# Patient Record
Sex: Female | Born: 1975 | Race: White | Hispanic: No | Marital: Married | Smoking: Former smoker
Health system: Southern US, Community
[De-identification: ages and names within clinical notes are randomized; demographics above are authoritative.]

## PROBLEM LIST (undated history)

## (undated) DIAGNOSIS — Z87891 Personal history of nicotine dependence: Secondary | ICD-10-CM

## (undated) DIAGNOSIS — R1032 Left lower quadrant pain: Secondary | ICD-10-CM

## (undated) DIAGNOSIS — K08409 Partial loss of teeth, unspecified cause, unspecified class: Secondary | ICD-10-CM

## (undated) DIAGNOSIS — F329 Major depressive disorder, single episode, unspecified: Secondary | ICD-10-CM

## (undated) DIAGNOSIS — F988 Other specified behavioral and emotional disorders with onset usually occurring in childhood and adolescence: Secondary | ICD-10-CM

## (undated) DIAGNOSIS — K589 Irritable bowel syndrome without diarrhea: Secondary | ICD-10-CM

## (undated) DIAGNOSIS — Z9049 Acquired absence of other specified parts of digestive tract: Secondary | ICD-10-CM

## (undated) DIAGNOSIS — K219 Gastro-esophageal reflux disease without esophagitis: Secondary | ICD-10-CM

## (undated) DIAGNOSIS — F32A Depression, unspecified: Secondary | ICD-10-CM

## (undated) DIAGNOSIS — F419 Anxiety disorder, unspecified: Secondary | ICD-10-CM

## (undated) HISTORY — PX: FLEXIBLE SIGMOIDOSCOPY: SHX1649

## (undated) HISTORY — PX: APPENDECTOMY: SHX54

## (undated) HISTORY — PX: WISDOM TOOTH EXTRACTION: SHX21

## (undated) HISTORY — DX: Other specified behavioral and emotional disorders with onset usually occurring in childhood and adolescence: F98.8

---

## 2011-11-15 LAB — OB RESULTS CONSOLE ABO/RH: RH Type: POSITIVE

## 2011-11-15 LAB — OB RESULTS CONSOLE HEPATITIS B SURFACE ANTIGEN: Hepatitis B Surface Ag: NEGATIVE

## 2011-11-23 ENCOUNTER — Other Ambulatory Visit (HOSPITAL_COMMUNITY)
Admission: RE | Admit: 2011-11-23 | Discharge: 2011-11-23 | Disposition: A | Discharge status: Home / Self Care | Payer: Self-pay | Source: Ambulatory Visit | Attending: Obstetrics and Gynecology | Admitting: Obstetrics and Gynecology

## 2011-11-23 DIAGNOSIS — Z01419 Encounter for gynecological examination (general) (routine) without abnormal findings: Secondary | ICD-10-CM | POA: Insufficient documentation

## 2011-11-23 DIAGNOSIS — Z113 Encounter for screening for infections with a predominantly sexual mode of transmission: Secondary | ICD-10-CM | POA: Insufficient documentation

## 2011-12-30 ENCOUNTER — Other Ambulatory Visit (HOSPITAL_COMMUNITY): Payer: Self-pay | Admitting: Obstetrics and Gynecology

## 2011-12-30 ENCOUNTER — Other Ambulatory Visit: Payer: Self-pay

## 2011-12-30 DIAGNOSIS — O280 Abnormal hematological finding on antenatal screening of mother: Secondary | ICD-10-CM

## 2012-01-13 ENCOUNTER — Ambulatory Visit (HOSPITAL_COMMUNITY)
Admission: RE | Admit: 2012-01-13 | Discharge: 2012-01-13 | Disposition: A | Discharge status: Home / Self Care | Payer: Managed Care, Other (non HMO) | Source: Ambulatory Visit | Attending: Obstetrics and Gynecology | Admitting: Obstetrics and Gynecology

## 2012-01-13 ENCOUNTER — Encounter (HOSPITAL_COMMUNITY): Payer: Self-pay

## 2012-01-13 DIAGNOSIS — O280 Abnormal hematological finding on antenatal screening of mother: Secondary | ICD-10-CM

## 2012-01-13 DIAGNOSIS — Z1389 Encounter for screening for other disorder: Secondary | ICD-10-CM | POA: Insufficient documentation

## 2012-01-13 DIAGNOSIS — Z363 Encounter for antenatal screening for malformations: Secondary | ICD-10-CM | POA: Insufficient documentation

## 2012-01-13 DIAGNOSIS — O358XX0 Maternal care for other (suspected) fetal abnormality and damage, not applicable or unspecified: Secondary | ICD-10-CM | POA: Insufficient documentation

## 2012-01-13 DIAGNOSIS — O09519 Supervision of elderly primigravida, unspecified trimester: Secondary | ICD-10-CM | POA: Insufficient documentation

## 2012-01-13 DIAGNOSIS — O289 Unspecified abnormal findings on antenatal screening of mother: Secondary | ICD-10-CM | POA: Insufficient documentation

## 2012-01-13 NOTE — Progress Notes (Signed)
Ms. Laural Benes had an ultrasound appointment today.  Please see [facility] report for details.  Comments 1. An active singleton fetus is observed.  2. Biometry is appropriate for gestational age.  3. Amniotic fluid volume is normal.  4. Screening survey of the fetal anatomy was performed and choroid plexus (CP) cysts were identified. A targeted survey of the fetal anatomy was therefore performed and no other dysmorphic features, or morphologic "soft markers" associated with aneuploidy, were detected. Isolated choroid plexus cysts are seen in approximately 2% of second trimester fetuses, but in more than 90% of cases they resolve by 26 weeks. They are typically of no pathological significance. Several investigators, however, have reported a very modestly increased risk for trisomy 45, and less likely trisomy 27, when isolated CP cysts are identified. In the absence of other markers for trisomy 18, the patient's pretest risk is elevated by a factor of 1.5. A review of the patient's medical records demonstrate a test that is negative for detectable aneuploidy by free fetal DNA, and, thus, the patient remains low risk. Given that this has no functional implication and that the patient remains low risk for aneuploidy, the finding of CPCs was not discussed with your patient today.  Impression Active singleton fetus Size appropriate for dates CPC (isolated) Normal amniotic fluid volume   Recommendations Follow up as clinically indicated.  Rogelia Boga, MD, MS, FACOG Assistant Professor Section of Maternal-Fetal Medicine [facility]

## 2012-03-22 LAB — OB RESULTS CONSOLE RPR: RPR: NONREACTIVE

## 2012-05-10 NOTE — L&D Delivery Note (Signed)
Delivery Note At 8:59 PM a viable female was delivered via Vaginal, Spontaneous Delivery (Presentation: Right Occiput Anterior).  APGAR: 9, 9; weight .   Placenta status: Intact, Spontaneous.  Cord: 3 vessels with the following complications: Long.  Cord pH: n/a  Head and shoulders delivered prior to suctioning of mouth and nose.  Anesthesia: Epidural Local Lidocaine 1% Less than 10 cc Episiotomy: None Lacerations: 2nd degree;Vaginal;Perineal (Rt vaginal laceration to middle 1/3 of vagina). Suture Repair: 2.0 3.0 chromic Est. Blood Loss (mL): 300 Digital rectal exam wnl.  No palpable suture, normal rectal tone. Mom to postpartum.  Baby to skin to skin.  Geryl Rankins 06/02/2012, 9:49 PM

## 2012-05-15 LAB — OB RESULTS CONSOLE GBS: GBS: NEGATIVE

## 2012-05-31 ENCOUNTER — Inpatient Hospital Stay (HOSPITAL_COMMUNITY)
Admission: AD | Admit: 2012-05-31 | Discharge: 2012-05-31 | Disposition: A | Discharge status: Home / Self Care | Payer: Managed Care, Other (non HMO) | Source: Ambulatory Visit | Attending: Obstetrics and Gynecology | Admitting: Obstetrics and Gynecology

## 2012-05-31 ENCOUNTER — Encounter (HOSPITAL_COMMUNITY): Payer: Self-pay | Admitting: *Deleted

## 2012-05-31 DIAGNOSIS — O479 False labor, unspecified: Secondary | ICD-10-CM | POA: Insufficient documentation

## 2012-05-31 HISTORY — DX: Anxiety disorder, unspecified: F41.9

## 2012-05-31 HISTORY — DX: Irritable bowel syndrome, unspecified: K58.9

## 2012-05-31 HISTORY — DX: Major depressive disorder, single episode, unspecified: F32.9

## 2012-05-31 HISTORY — DX: Depression, unspecified: F32.A

## 2012-05-31 MED ORDER — LACTATED RINGERS IV SOLN
INTRAVENOUS | Status: DC
  Administered 2012-05-31 (×2): via INTRAVENOUS

## 2012-05-31 MED ORDER — OXYCODONE-ACETAMINOPHEN 5-325 MG PO TABS
2.0000 | ORAL_TABLET | Freq: Once | ORAL | Status: AC
  Administered 2012-05-31: 2 via ORAL
  Filled 2012-05-31: qty 2

## 2012-05-31 MED ORDER — MORPHINE SULFATE 10 MG/ML IJ SOLN
5.0000 mg | Freq: Once | INTRAMUSCULAR | Status: AC
  Administered 2012-05-31: 5 mg via INTRAVENOUS
  Filled 2012-05-31: qty 1

## 2012-05-31 NOTE — Progress Notes (Signed)
Patient and husband removed EFM and patient dressed to go home. Patient has felt no relief of pain from Percocet, but they state they want to go home because she is not comfortable here. Dr. Richardson Dopp notified and wanted to speak with patient. Phone taken to room, Dr. Richardson Dopp and patient had conversation. Decision was made to keep patient longer for observation, give IV fluids and IV pain medication. Patient and husband feel better with this plan and now feel that they have some "direction."

## 2012-05-31 NOTE — MAU Note (Signed)
Contractions since yesterday and through the night. Was not dilated at appointment yesterday. States is having some bleeding this morning. States has some numbness in R hand throughout pregnancy.

## 2012-06-02 ENCOUNTER — Inpatient Hospital Stay (HOSPITAL_COMMUNITY)
Admission: AD | Admit: 2012-06-02 | Discharge: 2012-06-04 | DRG: 775 | Disposition: A | Discharge status: Home / Self Care | Payer: Managed Care, Other (non HMO) | Source: Ambulatory Visit | Attending: Obstetrics and Gynecology | Admitting: Obstetrics and Gynecology

## 2012-06-02 ENCOUNTER — Encounter (HOSPITAL_COMMUNITY): Payer: Self-pay | Admitting: Anesthesiology

## 2012-06-02 ENCOUNTER — Encounter (HOSPITAL_COMMUNITY): Payer: Self-pay | Admitting: *Deleted

## 2012-06-02 ENCOUNTER — Inpatient Hospital Stay (HOSPITAL_COMMUNITY): Payer: Managed Care, Other (non HMO) | Admitting: Anesthesiology

## 2012-06-02 DIAGNOSIS — O365990 Maternal care for other known or suspected poor fetal growth, unspecified trimester, not applicable or unspecified: Secondary | ICD-10-CM | POA: Diagnosis present

## 2012-06-02 LAB — RPR: RPR Ser Ql: NONREACTIVE

## 2012-06-02 LAB — CBC
MCH: 27.6 pg (ref 26.0–34.0)
MCHC: 33.2 g/dL (ref 30.0–36.0)
Platelets: 196 10*3/uL (ref 150–400)
RDW: 14.4 % (ref 11.5–15.5)

## 2012-06-02 MED ORDER — OXYTOCIN 40 UNITS IN LACTATED RINGERS INFUSION - SIMPLE MED
1.0000 m[IU]/min | INTRAVENOUS | Status: DC
  Administered 2012-06-02: 2 m[IU]/min via INTRAVENOUS

## 2012-06-02 MED ORDER — TERBUTALINE SULFATE 1 MG/ML IJ SOLN: 0.2500 mg | Freq: Once | INTRAMUSCULAR | Status: DC | PRN

## 2012-06-02 MED ORDER — ACETAMINOPHEN 325 MG PO TABS
650.0000 mg | ORAL_TABLET | ORAL | Status: DC | PRN
  Administered 2012-06-02: 650 mg via ORAL
  Filled 2012-06-02: qty 2

## 2012-06-02 MED ORDER — IBUPROFEN 600 MG PO TABS
600.0000 mg | ORAL_TABLET | Freq: Four times a day (QID) | ORAL | Status: DC | PRN
  Administered 2012-06-02: 600 mg via ORAL
  Filled 2012-06-02: qty 1

## 2012-06-02 MED ORDER — EPHEDRINE 5 MG/ML INJ
10.0000 mg | INTRAVENOUS | Status: DC | PRN
  Administered 2012-06-02: 10 mg via INTRAVENOUS
  Filled 2012-06-02: qty 4

## 2012-06-02 MED ORDER — LACTATED RINGERS IV SOLN
INTRAVENOUS | Status: DC
  Administered 2012-06-02 (×2): via INTRAVENOUS

## 2012-06-02 MED ORDER — FENTANYL 2.5 MCG/ML BUPIVACAINE 1/10 % EPIDURAL INFUSION (WH - ANES)
14.0000 mL/h | INTRAMUSCULAR | Status: DC
  Administered 2012-06-02 (×3): 14 mL/h via EPIDURAL
  Filled 2012-06-02 (×3): qty 125

## 2012-06-02 MED ORDER — ONDANSETRON HCL 4 MG/2ML IJ SOLN: 4.0000 mg | Freq: Four times a day (QID) | INTRAMUSCULAR | Status: DC | PRN

## 2012-06-02 MED ORDER — PHENYLEPHRINE 40 MCG/ML (10ML) SYRINGE FOR IV PUSH (FOR BLOOD PRESSURE SUPPORT)
80.0000 ug | PREFILLED_SYRINGE | INTRAVENOUS | Status: DC | PRN
  Administered 2012-06-02: 80 ug via INTRAVENOUS
  Filled 2012-06-02: qty 5

## 2012-06-02 MED ORDER — CITRIC ACID-SODIUM CITRATE 334-500 MG/5ML PO SOLN: 30.0000 mL | ORAL | Status: DC | PRN

## 2012-06-02 MED ORDER — EPHEDRINE 5 MG/ML INJ
10.0000 mg | INTRAVENOUS | Status: DC | PRN
  Administered 2012-06-02: 10 mg via INTRAVENOUS

## 2012-06-02 MED ORDER — PHENYLEPHRINE 40 MCG/ML (10ML) SYRINGE FOR IV PUSH (FOR BLOOD PRESSURE SUPPORT)
80.0000 ug | PREFILLED_SYRINGE | INTRAVENOUS | Status: DC | PRN
  Administered 2012-06-02: 80 ug via INTRAVENOUS

## 2012-06-02 MED ORDER — OXYTOCIN BOLUS FROM INFUSION
500.0000 mL | INTRAVENOUS | Status: DC
  Administered 2012-06-02: 500 mL via INTRAVENOUS

## 2012-06-02 MED ORDER — LIDOCAINE HCL (PF) 1 % IJ SOLN
30.0000 mL | INTRAMUSCULAR | Status: DC | PRN
  Administered 2012-06-02: 30 mL via SUBCUTANEOUS
  Filled 2012-06-02 (×2): qty 30

## 2012-06-02 MED ORDER — LACTATED RINGERS IV SOLN
500.0000 mL | INTRAVENOUS | Status: DC | PRN
  Administered 2012-06-02: 500 mL via INTRAVENOUS

## 2012-06-02 MED ORDER — SODIUM BICARBONATE 8.4 % IV SOLN
INTRAVENOUS | Status: DC | PRN
  Administered 2012-06-02: 5 mL via EPIDURAL

## 2012-06-02 MED ORDER — OXYCODONE-ACETAMINOPHEN 5-325 MG PO TABS
1.0000 | ORAL_TABLET | ORAL | Status: DC | PRN
  Administered 2012-06-02: 1 via ORAL
  Filled 2012-06-02: qty 1

## 2012-06-02 MED ORDER — LACTATED RINGERS IV SOLN
500.0000 mL | Freq: Once | INTRAVENOUS | Status: AC
  Administered 2012-06-02: 500 mL via INTRAVENOUS

## 2012-06-02 MED ORDER — OXYTOCIN 40 UNITS IN LACTATED RINGERS INFUSION - SIMPLE MED
62.5000 mL/h | INTRAVENOUS | Status: DC
  Filled 2012-06-02: qty 1000

## 2012-06-02 MED ORDER — DIPHENHYDRAMINE HCL 50 MG/ML IJ SOLN: 12.5000 mg | INTRAMUSCULAR | Status: DC | PRN

## 2012-06-02 NOTE — MAU Note (Signed)
contractions 

## 2012-06-02 NOTE — Anesthesia Preprocedure Evaluation (Signed)

## 2012-06-02 NOTE — Anesthesia Procedure Notes (Signed)

## 2012-06-02 NOTE — H&P (Addendum)
Gabrielle Pierce is a 37 y.o. female G1 at 43 4/7 week presenting for contractions.  Latent labor x 2 days.  Upon arrival, pt was 2-3/80/-2.  Epidural placed soon after admission.  Pt is comfortable. Unable to see fluid with AROM, bag not appreciated.  Pt states she felt water run down her leg at 04:30 prior to getting to hospital. [facility] has been uncomplicated except for an abnormal quad screen.  Harmony was low risk.  Pt had an ultrasound last week.  EFW was 5 pounds, 11 ounces.  Maternal Medical History:  Reason for admission: Reason for admission: contractions.  Reason for Admission:   nauseaContractions: Onset was 2 days ago.   Frequency: irregular.   Duration is approximately 2 minutes.   Perceived severity is strong.    Fetal activity: Perceived fetal activity is normal.    Prenatal complications: Abnormal quad screen.  Harmony negative/low risk.  Prenatal Complications - Diabetes: none.    OB History    Grav Para Term Preterm Abortions TAB SAB Ect Mult Living   1 0 0 0 0 0 0 0 0 0      Past Medical History  Diagnosis Date  . IBS (irritable bowel syndrome)   . Depression   . Anxiety     prev. use of PRN xanax   Past Surgical History  Procedure Date  . Appendectomy   . Flexible sigmoidoscopy   . Wisdom tooth extraction    Family History: family history is not on file. Social History:  reports that she has quit smoking. Her smoking use included Cigarettes. She has never used smokeless tobacco. She reports that she does not drink alcohol or use illicit drugs.   Review of Systems  Constitutional: Negative for fever and chills.  HENT: Negative for congestion and sore throat.   Respiratory: Negative for shortness of breath.   Gastrointestinal: Positive for abdominal pain. Negative for nausea, vomiting and diarrhea.    Dilation: 3.5 Effacement (%): 90 Station: -1 Exam by:: vernado Blood pressure 122/77, pulse 83, temperature 98.4 F (36.9 C), temperature source Oral,  resp. rate 20, height 5\' 6"  (1.676 m), weight 104.781 kg (231 lb), last menstrual period 08/30/2011, SpO2 96.00%. Maternal Exam:  Uterine Assessment: Contraction duration is 3 minutes. Contraction frequency is regular.   Abdomen: Estimated fetal weight is 6 lbs..   Fetal presentation: vertex  Introitus: Normal vulva. Normal vagina.  Ferning test: not done.  Nitrazine test: not done. Bloody show.  Pelvis: adequate for delivery.   Cervix: Cervix evaluated by digital exam.     Fetal Exam Fetal Monitor Review: Baseline rate: Reactive.  Variability: moderate (6-25 bpm).   Pattern: accelerations present.    Fetal State Assessment: Category I - tracings are normal.     Physical Exam  Constitutional: She is oriented to person, place, and time. She appears well-developed and well-nourished. No distress.  HENT:  Head: Normocephalic and atraumatic.  Eyes: EOM are normal.  Neck: Normal range of motion.  Cardiovascular: Normal rate, regular rhythm and normal heart sounds.   GI: Soft. There is no tenderness.  Genitourinary: Uterus normal.  Musculoskeletal: She exhibits edema.  Neurological: She is alert and oriented to person, place, and time.  Skin: Skin is warm and dry. She is not diaphoretic.  Psychiatric: She has a normal mood and affect.    Prenatal labs: ABO, Rh: --/--/B POS, B POS (01/24 0530) Antibody: NEG (01/24 0530) Rubella: Nonimmune (07/08 0000) RPR: NON REACTIVE (01/24 0530)  HBsAg: Negative (07/08 0000)  HIV: Non-reactive (07/08 0000)  GBS: Negative (01/06 0000)   Assessment/Plan: Labor augmented with Pitocin. Likely SROM. IUPC placed to assess contraction strength due to minimal change on Pitocin. Comfortable with epidural. SGA.  Anticipate vaginal delivery.   Geryl Rankins 06/02/2012, 1:51 PM

## 2012-06-03 LAB — CBC
HCT: 32.3 % — ABNORMAL LOW (ref 36.0–46.0)
MCH: 27.4 pg (ref 26.0–34.0)
MCV: 85.2 fL (ref 78.0–100.0)
RBC: 3.79 MIL/uL — ABNORMAL LOW (ref 3.87–5.11)
WBC: 15.6 10*3/uL — ABNORMAL HIGH (ref 4.0–10.5)

## 2012-06-03 MED ORDER — METHYLERGONOVINE MALEATE 0.2 MG/ML IJ SOLN: 0.2000 mg | INTRAMUSCULAR | Status: DC | PRN

## 2012-06-03 MED ORDER — BENZOCAINE-MENTHOL 20-0.5 % EX AERO
1.0000 "application " | INHALATION_SPRAY | CUTANEOUS | Status: DC | PRN
  Administered 2012-06-03: 1 via TOPICAL
  Filled 2012-06-03: qty 56

## 2012-06-03 MED ORDER — METHYLERGONOVINE MALEATE 0.2 MG PO TABS: 0.2000 mg | ORAL_TABLET | ORAL | Status: DC | PRN

## 2012-06-03 MED ORDER — PRENATAL MULTIVITAMIN CH
1.0000 | ORAL_TABLET | Freq: Every day | ORAL | Status: DC
  Administered 2012-06-03 – 2012-06-04 (×2): 1 via ORAL
  Filled 2012-06-03 (×2): qty 1

## 2012-06-03 MED ORDER — OXYCODONE-ACETAMINOPHEN 5-325 MG PO TABS
1.0000 | ORAL_TABLET | ORAL | Status: DC | PRN
  Administered 2012-06-03 – 2012-06-04 (×3): 1 via ORAL
  Filled 2012-06-03 (×4): qty 1

## 2012-06-03 MED ORDER — SENNOSIDES-DOCUSATE SODIUM 8.6-50 MG PO TABS: 2.0000 | ORAL_TABLET | Freq: Every day | ORAL | Status: DC

## 2012-06-03 MED ORDER — LANOLIN HYDROUS EX OINT: TOPICAL_OINTMENT | CUTANEOUS | Status: DC | PRN

## 2012-06-03 MED ORDER — DIPHENHYDRAMINE HCL 25 MG PO CAPS: 25.0000 mg | ORAL_CAPSULE | Freq: Four times a day (QID) | ORAL | Status: DC | PRN

## 2012-06-03 MED ORDER — FERROUS SULFATE 325 (65 FE) MG PO TABS
325.0000 mg | ORAL_TABLET | Freq: Two times a day (BID) | ORAL | Status: DC
  Administered 2012-06-03 – 2012-06-04 (×3): 325 mg via ORAL
  Filled 2012-06-03 (×3): qty 1

## 2012-06-03 MED ORDER — OXYTOCIN 40 UNITS IN LACTATED RINGERS INFUSION - SIMPLE MED: 62.5000 mL/h | INTRAVENOUS | Status: DC | PRN

## 2012-06-03 MED ORDER — IBUPROFEN 600 MG PO TABS
600.0000 mg | ORAL_TABLET | Freq: Four times a day (QID) | ORAL | Status: DC
  Administered 2012-06-03 – 2012-06-04 (×6): 600 mg via ORAL
  Filled 2012-06-03 (×6): qty 1

## 2012-06-03 MED ORDER — SERTRALINE HCL 50 MG PO TABS
50.0000 mg | ORAL_TABLET | Freq: Every day | ORAL | Status: DC
  Administered 2012-06-03 (×2): 50 mg via ORAL
  Filled 2012-06-03 (×3): qty 1

## 2012-06-03 MED ORDER — DIBUCAINE 1 % RE OINT
1.0000 "application " | TOPICAL_OINTMENT | RECTAL | Status: DC | PRN
  Administered 2012-06-03: 1 via RECTAL
  Filled 2012-06-03: qty 28

## 2012-06-03 MED ORDER — MAGNESIUM HYDROXIDE 400 MG/5ML PO SUSP: 30.0000 mL | ORAL | Status: DC | PRN

## 2012-06-03 MED ORDER — ONDANSETRON HCL 4 MG/2ML IJ SOLN: 4.0000 mg | INTRAMUSCULAR | Status: DC | PRN

## 2012-06-03 MED ORDER — WITCH HAZEL-GLYCERIN EX PADS
1.0000 "application " | MEDICATED_PAD | CUTANEOUS | Status: DC | PRN
  Administered 2012-06-03: 1 via TOPICAL

## 2012-06-03 MED ORDER — ONDANSETRON HCL 4 MG PO TABS: 4.0000 mg | ORAL_TABLET | ORAL | Status: DC | PRN

## 2012-06-03 MED ORDER — SIMETHICONE 80 MG PO CHEW: 80.0000 mg | CHEWABLE_TABLET | ORAL | Status: DC | PRN

## 2012-06-03 NOTE — Anesthesia Postprocedure Evaluation (Signed)
  Anesthesia Post-op Note  Patient: Gabrielle Pierce  Procedure(s) Performed: * No procedures listed *  Patient Location: Mother/Baby  Anesthesia Type:Epidural  Level of Consciousness: awake, alert , oriented and patient cooperative  Airway and Oxygen Therapy: Patient Spontanous Breathing  Post-op Pain: mild  Post-op Assessment: Patient's Cardiovascular Status Stable, Respiratory Function Stable, RESPIRATORY FUNCTION UNSTABLE, No signs of Nausea or vomiting, Adequate PO intake and Pain level controlled  Post-op Vital Signs: Reviewed  Complications: No apparent anesthesia complications

## 2012-06-03 NOTE — Anesthesia Postprocedure Evaluation (Deleted)
  Anesthesia Post-op Note  Patient: Gabrielle Pierce  Procedure(s) Performed: * No procedures listed *  Patient Location: Mother/Baby  Anesthesia Type:Epidural  Level of Consciousness: awake, alert , oriented and patient cooperative  Airway and Oxygen Therapy: Patient Spontanous Breathing  Post-op Pain: Mild  Post-op Assessment: Patient's Cardiovascular Status Stable, Respiratory Function Stable, Patent Airway, No signs of Nausea or vomiting, Adequate PO intake and Pain level controlled  Post-op Vital Signs: Reviewed and stable  Complications: No apparent anesthesia complications

## 2012-06-03 NOTE — Progress Notes (Signed)
Post Partum Day 1 Subjective: no complaints  Bleeding is slowing.  Objective: Blood pressure 115/73, pulse 65, temperature 98.4 F (36.9 C), temperature source Oral, resp. rate 18, height 5\' 6"  (1.676 m), weight 104.781 kg (231 lb), last menstrual period 08/30/2011, SpO2 97.00%, unknown if currently breastfeeding.  Physical Exam:  General: alert, cooperative and no distress Lochia: not assessed Uterine Fundus: firm DVT Evaluation: No evidence of DVT seen on physical exam.   Basename 06/03/12 0602 06/02/12 0530  HGB 10.5* 12.7  HCT 32.3* 38.3    Assessment/Plan: Plan for discharge tomorrow Continue routine pp care.   LOS: 1 day   Britley Gashi 06/03/2012, 3:54 PM

## 2012-06-04 MED ORDER — IBUPROFEN 600 MG PO TABS: 600.0000 mg | ORAL_TABLET | Freq: Four times a day (QID) | ORAL | Status: DC

## 2012-06-04 MED ORDER — OXYCODONE-ACETAMINOPHEN 5-325 MG PO TABS: 1.0000 | ORAL_TABLET | ORAL | Status: DC | PRN

## 2012-06-04 MED ORDER — MEASLES, MUMPS & RUBELLA VAC ~~LOC~~ INJ
0.5000 mL | INJECTION | Freq: Once | SUBCUTANEOUS | Status: AC
  Administered 2012-06-04: 0.5 mL via SUBCUTANEOUS
  Filled 2012-06-04: qty 0.5

## 2012-06-04 NOTE — Progress Notes (Signed)
Post Partum Day 2 Subjective: no complaints  Bleeding slowing.  Baby with fever overnight.  Normal now.  WBCs normal.  Baby to stay for observation.  Objective: Blood pressure 114/75, pulse 58, temperature 97.7 F (36.5 C), temperature source Oral, resp. rate 18, height 5\' 6"  (1.676 m), weight 104.781 kg (231 lb), last menstrual period 08/30/2011, SpO2 97.00%, unknown if currently breastfeeding.  Physical Exam:  General: alert, cooperative and no distress Lochia: appropriate Uterine Fundus: firm Incision: not assessed DVT Evaluation: No evidence of DVT seen on physical exam.   Basename 06/03/12 0602 06/02/12 0530  HGB 10.5* 12.7  HCT 32.3* 38.3    Assessment/Plan: Discharge home H/o depression on Zoloft 50 mg.  F/u in 2 weeks.   Rubella non-immune. MMR prior to discharge.   LOS: 2 days   Meagan Spease 06/04/2012, 12:06 PM

## 2012-06-04 NOTE — Discharge Summary (Signed)
Obstetric Discharge Summary Reason for Admission: onset of labor Prenatal Procedures: none Intrapartum Procedures: spontaneous vaginal delivery Postpartum Procedures: Rubella Ig Complications-Operative and Postpartum: 2nd degree perineal laceration and vaginal laceration Hemoglobin  Date Value Range Status  06/03/2012 10.5* 12.0 - 15.0 g/dL Final     REPEATED TO VERIFY     DELTA CHECK NOTED     HCT  Date Value Range Status  06/03/2012 32.3* 36.0 - 46.0 % Final    Physical Exam:  WNL.  See progress notes.  Discharge Diagnoses: Term Pregnancy-delivered  Discharge Information: Date: 06/04/2012 Activity: pelvic rest Diet: routine Medications: PNV, Ibuprofen, Percocet and Zoloft 50 mg Condition: stable Instructions: See discharge instructions. Discharge to: home Follow-up Information    Follow up with Jessee Avers., MD. Schedule an appointment as soon as possible for a visit in 6 weeks. (Postpartum check)    Contact information:   [address] [place] [address] [phone]       Follow up with Jessee Avers., MD. Schedule an appointment as soon as possible for a visit in 2 weeks. (Baby blues check)    Contact information:   [address] [place] [address] [phone]          Newborn Data: Live born female  Birth Weight: 6 lb 5.1 oz (2865 g) APGAR: 9, 9  Fever postpartum day #2. WBCs wnl per pt.   Will stay overnight for observation.  Geryl Rankins 06/04/2012, 12:15 PM

## 2012-06-06 NOTE — Progress Notes (Signed)
Post discharge chart review completed.  

## 2013-02-09 ENCOUNTER — Encounter (HOSPITAL_COMMUNITY): Payer: Self-pay | Admitting: Emergency Medicine

## 2013-02-09 ENCOUNTER — Emergency Department (HOSPITAL_COMMUNITY)
Admission: EM | Admit: 2013-02-09 | Discharge: 2013-02-09 | Disposition: A | Discharge status: Home / Self Care | Payer: Managed Care, Other (non HMO) | Attending: Emergency Medicine | Admitting: Emergency Medicine

## 2013-02-09 DIAGNOSIS — S81009A Unspecified open wound, unspecified knee, initial encounter: Secondary | ICD-10-CM | POA: Insufficient documentation

## 2013-02-09 DIAGNOSIS — Z8659 Personal history of other mental and behavioral disorders: Secondary | ICD-10-CM | POA: Insufficient documentation

## 2013-02-09 DIAGNOSIS — Z203 Contact with and (suspected) exposure to rabies: Secondary | ICD-10-CM | POA: Insufficient documentation

## 2013-02-09 DIAGNOSIS — Y939 Activity, unspecified: Secondary | ICD-10-CM | POA: Insufficient documentation

## 2013-02-09 DIAGNOSIS — Z23 Encounter for immunization: Secondary | ICD-10-CM | POA: Insufficient documentation

## 2013-02-09 DIAGNOSIS — Y929 Unspecified place or not applicable: Secondary | ICD-10-CM | POA: Insufficient documentation

## 2013-02-09 DIAGNOSIS — Z87891 Personal history of nicotine dependence: Secondary | ICD-10-CM | POA: Insufficient documentation

## 2013-02-09 DIAGNOSIS — Z8719 Personal history of other diseases of the digestive system: Secondary | ICD-10-CM | POA: Insufficient documentation

## 2013-02-09 DIAGNOSIS — W540XXA Bitten by dog, initial encounter: Secondary | ICD-10-CM | POA: Insufficient documentation

## 2013-02-09 DIAGNOSIS — Z79899 Other long term (current) drug therapy: Secondary | ICD-10-CM | POA: Insufficient documentation

## 2013-02-09 MED ORDER — AMOXICILLIN-POT CLAVULANATE 875-125 MG PO TABS: 1.0000 | ORAL_TABLET | Freq: Two times a day (BID) | ORAL | Status: DC

## 2013-02-09 MED ORDER — RABIES VACCINE, PCEC IM SUSR
1.0000 mL | Freq: Once | INTRAMUSCULAR | Status: AC
  Administered 2013-02-09: 1 mL via INTRAMUSCULAR
  Filled 2013-02-09: qty 1

## 2013-02-09 MED ORDER — RABIES IMMUNE GLOBULIN 150 UNIT/ML IM INJ
20.0000 [IU]/kg | INJECTION | Freq: Once | INTRAMUSCULAR | Status: AC
  Administered 2013-02-09: 1875 [IU] via INTRAMUSCULAR
  Filled 2013-02-09: qty 12.5

## 2013-02-09 NOTE — ED Provider Notes (Signed)
CSN: [id number]     Arrival date & time 02/09/13  1627 History   First MD Initiated Contact with Patient 02/09/13 1651     Chief Complaint  Patient presents with  . Animal Bite   (Consider location/radiation/quality/duration/timing/severity/associated sxs/prior Treatment) HPI Comments: [name] who presents today after being bit by a pitbull on Tuesday. She reports she was out walking with her 15-month-old child when the dog attacked her. She had 3 bites to her left calf. Since the time of the attack the bite has healed. She has one scab to the back of her left calf. Her tetanus shot is up to date. She reports that she has been in contact with animal control. The dog has not been found and the owner has confessed that the dog is not up-to-date on his immunizations. It was recommended to her that she come to the emergency department for the rabies vaccine and injections. She denies fever, chills, malaise, myalgias, nausea, vomiting, shortness of breath, chest pain, numbness, weakness, paresthesias.  The history is provided by the patient. No language interpreter was used.    Past Medical History  Diagnosis Date  . IBS (irritable bowel syndrome)   . Depression   . Anxiety     prev. use of PRN xanax   Past Surgical History  Procedure Laterality Date  . Appendectomy    . Flexible sigmoidoscopy    . Wisdom tooth extraction     No family history on file. History  Substance Use Topics  . Smoking status: Former Smoker    Types: Cigarettes  . Smokeless tobacco: Never Used  . Alcohol Use: No     Comment: Prior to preg.   OB History   Grav Para Term Preterm Abortions TAB SAB Ect Mult Living   1 1 1  0 0 0 0 0 0 1     Review of Systems  Constitutional: Negative for fever, chills, diaphoresis and fatigue.  Respiratory: Negative for shortness of breath.   Cardiovascular: Negative for chest pain.  Gastrointestinal: Negative for nausea, vomiting and abdominal pain.   Skin: Positive for wound.  All other systems reviewed and are negative.    Allergies  Review of patient's allergies indicates no known allergies.  Home Medications   Current Outpatient Rx  Name  Route  Sig  Dispense  Refill  . amphetamine-dextroamphetamine (ADDERALL) 30 MG tablet   Oral   Take 15-30 mg by mouth 3 (three) times daily. Take 30mg  in the morning, 30mg  in the afternoon and 15mg  in the evening         . ibuprofen (ADVIL,MOTRIN) 200 MG tablet   Oral   Take 400 mg by mouth every 6 (six) hours as needed for pain.          BP 106/91  Pulse 92  Temp(Src) 98.1 F (36.7 C) (Oral)  Resp 18  Wt 205 lb (92.987 kg)  BMI 33.1 kg/m2  SpO2 100%  LMP 01/26/2013 Physical Exam  Nursing note and vitals reviewed. Constitutional: She is oriented to person, place, and time. She appears well-developed and well-nourished. No distress.  HENT:  Head: Normocephalic and atraumatic.  Right Ear: External ear normal.  Left Ear: External ear normal.  Nose: Nose normal.  Mouth/Throat: Oropharynx is clear and moist.  Eyes: Conjunctivae are normal.  Neck: Normal range of motion.  Cardiovascular: Normal rate, regular rhythm and normal heart sounds.   Pulmonary/Chest: Effort normal and breath sounds normal. No stridor. No respiratory distress.  She has no wheezes. She has no rales.  Abdominal: Soft. She exhibits no distension.  Musculoskeletal: Normal range of motion.  Neurological: She is alert and oriented to person, place, and time. She has normal strength.  Skin: Skin is warm and dry. She is not diaphoretic. No erythema.  1 cm area of scabbing on posterior left calf. The area appears to be healing well without surrounding erythema, streaking, warmth.  Psychiatric: She has a normal mood and affect. Her behavior is normal.    ED Course  Procedures (including critical care time) Labs Review Labs Reviewed - No data to display Imaging Review No results found.  MDM   1. Rabies  exposure   2. Dog bite, initial encounter    Patient presents after possible rabies exposure. She had 3 well healing dog bite on her left calf. She was given a prescription for Augmentin, but this only needs to fill this if she begins to show signs of infection including fever, chills, myalgias, erythema, streaking, warmth. Patient is reasonable and expressed understanding. Rabies vaccine and immunoglobulin was given today. TDAP up to date. The patient understands her followup with urgent care center in 3 days. Return instructions given. Vital signs stable for discharge. Patient / Family / Caregiver informed of clinical course, understand medical decision-making process, and agree with plan.     Mora Bellman, PA-C 02/09/13 2124

## 2013-02-09 NOTE — ED Notes (Signed)
Pt reports being bitten by a dog on her left lateral ankle and calf area. Pt reports that the animal has not been found after the bite because the animal jumped a fence as animal control tried to obtain the animal. The owner reported to the patient that the animal was not up to date on vaccinations.One small puncture wound noted to lateral ankle and posterior calf.  Pt is A/O x4 and in NAD.

## 2013-02-10 NOTE — ED Provider Notes (Signed)
Medical screening examination/treatment/procedure(s) were performed by non-physician practitioner and as supervising physician I was immediately available for consultation/collaboration.  Lorien Shingler M Terrion Poblano, MD 02/10/13 1324 

## 2013-02-12 ENCOUNTER — Emergency Department (HOSPITAL_COMMUNITY)
Admission: EM | Admit: 2013-02-12 | Discharge: 2013-02-12 | Disposition: A | Discharge status: Home / Self Care | Payer: Managed Care, Other (non HMO) | Source: Home / Self Care

## 2013-02-12 ENCOUNTER — Encounter (HOSPITAL_COMMUNITY): Payer: Self-pay | Admitting: Emergency Medicine

## 2013-02-12 DIAGNOSIS — Z203 Contact with and (suspected) exposure to rabies: Secondary | ICD-10-CM

## 2013-02-12 MED ORDER — RABIES VACCINE, PCEC IM SUSR
INTRAMUSCULAR | Status: AC
  Filled 2013-02-12: qty 1

## 2013-02-12 MED ORDER — RABIES VACCINE, PCEC IM SUSR
1.0000 mL | Freq: Once | INTRAMUSCULAR | Status: AC
  Administered 2013-02-12: 1 mL via INTRAMUSCULAR

## 2013-02-12 NOTE — ED Notes (Signed)
Rabies injection 

## 2013-02-18 ENCOUNTER — Emergency Department (HOSPITAL_COMMUNITY)
Admission: EM | Admit: 2013-02-18 | Discharge: 2013-02-18 | Disposition: A | Discharge status: Home / Self Care | Payer: Managed Care, Other (non HMO) | Source: Home / Self Care

## 2013-02-18 ENCOUNTER — Encounter (HOSPITAL_COMMUNITY): Payer: Self-pay | Admitting: Emergency Medicine

## 2013-02-18 MED ORDER — RABIES VACCINE, PCEC IM SUSR
1.0000 mL | Freq: Once | INTRAMUSCULAR | Status: AC
  Administered 2013-02-18: 1 mL via INTRAMUSCULAR

## 2013-02-18 MED ORDER — RABIES VACCINE, PCEC IM SUSR
INTRAMUSCULAR | Status: AC
  Filled 2013-02-18: qty 1

## 2013-02-18 NOTE — ED Notes (Signed)
Pt here for 3rd rabies injection. Pt reports no reaction to previous injections.

## 2014-03-11 ENCOUNTER — Encounter (HOSPITAL_COMMUNITY): Payer: Self-pay | Admitting: Emergency Medicine

## 2014-05-10 NOTE — L&D Delivery Note (Signed)
Delivery Note At 5:44 AM a healthy female was delivered via Vaginal, Spontaneous Delivery (Presentation: Occiput ant  ).  APGAR: 9, 9; weight  Pending.   Placenta status: Intact, Spontaneous.  Cord: 3 vessels with the following complications: None.  Cord pH: N/A  Anesthesia: Epidural  Episiotomy:  none Lacerations: 1st degree;Labial Suture Repair: 3.0 vicryl rapide Est. Blood Loss (mL):  200  Mom to postpartum.  Baby to Couplet care / Skin to Skin.  Jonai Weyland,MARIE-LYNE 01/18/2015, 6:04 AM

## 2014-06-05 LAB — OB RESULTS CONSOLE GC/CHLAMYDIA
Chlamydia: NEGATIVE
GC PROBE AMP, GENITAL: NEGATIVE

## 2014-06-05 LAB — OB RESULTS CONSOLE HIV ANTIBODY (ROUTINE TESTING): HIV: NONREACTIVE

## 2014-06-05 LAB — OB RESULTS CONSOLE ABO/RH: RH TYPE: POSITIVE

## 2014-06-05 LAB — OB RESULTS CONSOLE RUBELLA ANTIBODY, IGM: Rubella: NON-IMMUNE/NOT IMMUNE

## 2014-06-05 LAB — OB RESULTS CONSOLE RPR: RPR: NONREACTIVE

## 2014-06-05 LAB — OB RESULTS CONSOLE HEPATITIS B SURFACE ANTIGEN: Hepatitis B Surface Ag: NEGATIVE

## 2014-06-05 LAB — OB RESULTS CONSOLE ANTIBODY SCREEN: Antibody Screen: NEGATIVE

## 2014-12-25 LAB — OB RESULTS CONSOLE GBS: STREP GROUP B AG: NEGATIVE

## 2015-01-05 ENCOUNTER — Inpatient Hospital Stay (HOSPITAL_COMMUNITY)
Admission: RE | Admit: 2015-01-05 | Discharge: 2015-01-06 | Disposition: A | Discharge status: Home / Self Care | Payer: Managed Care, Other (non HMO) | Source: Ambulatory Visit | Attending: Obstetrics & Gynecology | Admitting: Obstetrics & Gynecology

## 2015-01-05 ENCOUNTER — Encounter (HOSPITAL_COMMUNITY): Payer: Self-pay | Admitting: *Deleted

## 2015-01-05 DIAGNOSIS — Z3689 Encounter for other specified antenatal screening: Secondary | ICD-10-CM

## 2015-01-05 DIAGNOSIS — O26899 Other specified pregnancy related conditions, unspecified trimester: Secondary | ICD-10-CM

## 2015-01-05 DIAGNOSIS — Z3A37 37 weeks gestation of pregnancy: Secondary | ICD-10-CM | POA: Insufficient documentation

## 2015-01-05 DIAGNOSIS — O99213 Obesity complicating pregnancy, third trimester: Secondary | ICD-10-CM

## 2015-01-05 DIAGNOSIS — R109 Unspecified abdominal pain: Secondary | ICD-10-CM | POA: Insufficient documentation

## 2015-01-05 DIAGNOSIS — R1032 Left lower quadrant pain: Secondary | ICD-10-CM | POA: Diagnosis present

## 2015-01-05 DIAGNOSIS — Z3A36 36 weeks gestation of pregnancy: Secondary | ICD-10-CM

## 2015-01-05 DIAGNOSIS — O26893 Other specified pregnancy related conditions, third trimester: Secondary | ICD-10-CM | POA: Insufficient documentation

## 2015-01-05 HISTORY — DX: Left lower quadrant pain: R10.32

## 2015-01-05 NOTE — Progress Notes (Signed)
Message left regarding patient in MAU and requesting evaluation per MD

## 2015-01-05 NOTE — MAU Note (Signed)
Pt states that abdominal pain started about 30 minutes ago. Pt states that this labor pain feels different than the first and that there is pain in lower abdomen. Pt denies leaking of fluid and bleeding and states that baby is active.

## 2015-01-05 NOTE — Progress Notes (Signed)
Called to notify of pt arrival in MAU. Informed of cervical exam and patient's extreme discomfort. Received orders for urinalysis and ultrasound and to call midwife for evaluation in MAU

## 2015-01-06 ENCOUNTER — Inpatient Hospital Stay (HOSPITAL_COMMUNITY): Payer: Managed Care, Other (non HMO)

## 2015-01-06 ENCOUNTER — Encounter (HOSPITAL_COMMUNITY): Payer: Self-pay | Admitting: Obstetrics and Gynecology

## 2015-01-06 DIAGNOSIS — R1032 Left lower quadrant pain: Secondary | ICD-10-CM

## 2015-01-06 DIAGNOSIS — O26893 Other specified pregnancy related conditions, third trimester: Secondary | ICD-10-CM | POA: Diagnosis not present

## 2015-01-06 DIAGNOSIS — R109 Unspecified abdominal pain: Secondary | ICD-10-CM | POA: Diagnosis present

## 2015-01-06 DIAGNOSIS — Z3A37 37 weeks gestation of pregnancy: Secondary | ICD-10-CM | POA: Diagnosis not present

## 2015-01-06 HISTORY — DX: Left lower quadrant pain: R10.32

## 2015-01-06 LAB — URINALYSIS, ROUTINE W REFLEX MICROSCOPIC
BILIRUBIN URINE: NEGATIVE
Glucose, UA: NEGATIVE mg/dL
HGB URINE DIPSTICK: NEGATIVE
Ketones, ur: NEGATIVE mg/dL
NITRITE: NEGATIVE
PROTEIN: NEGATIVE mg/dL
SPECIFIC GRAVITY, URINE: 1.025 (ref 1.005–1.030)
UROBILINOGEN UA: 0.2 mg/dL (ref 0.0–1.0)
pH: 8.5 — ABNORMAL HIGH (ref 5.0–8.0)

## 2015-01-06 LAB — CBC
HEMATOCRIT: 38.2 % (ref 36.0–46.0)
HEMOGLOBIN: 12.7 g/dL (ref 12.0–15.0)
MCH: 27.8 pg (ref 26.0–34.0)
MCHC: 33.2 g/dL (ref 30.0–36.0)
MCV: 83.6 fL (ref 78.0–100.0)
Platelets: 175 10*3/uL (ref 150–400)
RBC: 4.57 MIL/uL (ref 3.87–5.11)
RDW: 14.9 % (ref 11.5–15.5)
WBC: 15.4 10*3/uL — AB (ref 4.0–10.5)

## 2015-01-06 LAB — URINE MICROSCOPIC-ADD ON

## 2015-01-06 MED ORDER — OXYCODONE-ACETAMINOPHEN 5-325 MG PO TABS
2.0000 | ORAL_TABLET | Freq: Once | ORAL | Status: AC
  Administered 2015-01-06: 2 via ORAL
  Filled 2015-01-06: qty 2

## 2015-01-06 NOTE — MAU Note (Signed)
Gabrielle Pierce given report and patient discharged to home with instructions. Patient requesting being tested for BV and [name] will do this in the office this week.

## 2015-01-06 NOTE — Discharge Instructions (Signed)
Abdominal Pain During Pregnancy Abdominal pain is common in pregnancy. Most of the time, it does not cause harm. There are many causes of abdominal pain. Some causes are more serious than others. Some of the causes of abdominal pain in pregnancy are easily diagnosed. Occasionally, the diagnosis takes time to understand. Other times, the cause is not determined. Abdominal pain can be a sign that something is very wrong with the pregnancy, or the pain may have nothing to do with the pregnancy at all. For this reason, always tell your health care provider if you have any abdominal discomfort. HOME CARE INSTRUCTIONS  Monitor your abdominal pain for any changes. The following actions may help to alleviate any discomfort you are experiencing:  Do not have sexual intercourse or put anything in your vagina until your symptoms go away completely.  Get plenty of rest until your pain improves.  Drink clear fluids if you feel nauseous. Avoid solid food as long as you are uncomfortable or nauseous.  Only take over-the-counter or prescription medicine as directed by your health care provider.  Keep all follow-up appointments with your health care provider. SEEK IMMEDIATE MEDICAL CARE IF:  You are bleeding, leaking fluid, or passing tissue from the vagina.  You have increasing pain or cramping.  You have persistent vomiting.  You have painful or bloody urination.  You have a fever.  You notice a decrease in your baby's movements.  You have extreme weakness or feel faint.  You have shortness of breath, with or without abdominal pain.  You develop a severe headache with abdominal pain.  You have abnormal vaginal discharge with abdominal pain.  You have persistent diarrhea.  You have abdominal pain that continues even after rest, or gets worse. MAKE SURE YOU:   Understand these instructions.  Will watch your condition.  Will get help right away if you are not doing well or get  worse. Document Released: 04/26/2005 Document Revised: 02/14/2013 Document Reviewed: 11/23/2012 [facility]ExitCare Patient Information 2015 [place], [place]. This information is not intended to replace advice given to you by your health care provider. Make sure you discuss any questions you have with your health care provider. Braxton Hicks Contractions Contractions of the uterus can occur throughout pregnancy. Contractions are not always a sign that you are in labor.  WHAT ARE BRAXTON HICKS CONTRACTIONS?  Contractions that occur before labor are called Braxton Hicks contractions, or false labor. Toward the end of pregnancy (32-34 weeks), these contractions can develop more often and may become more forceful. This is not true labor because these contractions do not result in opening (dilatation) and thinning of the cervix. They are sometimes difficult to tell apart from true labor because these contractions can be forceful and people have different pain tolerances. You should not feel embarrassed if you go to the hospital with false labor. Sometimes, the only way to tell if you are in true labor is for your health care provider to look for changes in the cervix. If there are no prenatal problems or other health problems associated with the pregnancy, it is completely safe to be sent home with false labor and await the onset of true labor. HOW CAN YOU TELL THE DIFFERENCE BETWEEN TRUE AND FALSE LABOR? False Labor  The contractions of false labor are usually shorter and not as hard as those of true labor.   The contractions are usually irregular.   The contractions are often felt in the front of the lower abdomen and in the groin.  groin.   °· The contractions may go away when you walk around or change positions while lying down.   °· The contractions get weaker and are shorter lasting as time goes on.   °· The contractions do not usually become progressively stronger, regular, and closer together as with true labor.    °True Labor °· Contractions in true labor last 30-70 seconds, become very regular, usually become more intense, and increase in frequency.   °· The contractions do not go away with walking.   °· The discomfort is usually felt in the top of the uterus and spreads to the lower abdomen and low back.   °· True labor can be determined by your health care provider with an exam. This will show that the cervix is dilating and getting thinner.   °WHAT TO REMEMBER °· Keep up with your usual exercises and follow other instructions given by your health care provider.   °· Take medicines as directed by your health care provider.   °· Keep your regular prenatal appointments.   °· Eat and drink lightly if you think you are going into labor.   °· If Braxton Hicks contractions are making you uncomfortable:   °¨ Change your position from lying down or resting to walking, or from walking to resting.   °¨ Sit and rest in a tub of warm water.   °¨ Drink 2-3 glasses of water. Dehydration may cause these contractions.   °¨ Do slow and deep breathing several times an hour.   °WHEN SHOULD I SEEK IMMEDIATE MEDICAL CARE? °Seek immediate medical care if: °· Your contractions become stronger, more regular, and closer together.   °· You have fluid leaking or gushing from your vagina.   °· You have a fever.   °· You pass blood-tinged mucus.   °· You have vaginal bleeding.   °· You have continuous abdominal pain.   °· You have low back pain that you never had before.   °· You feel your baby's head pushing down and causing pelvic pressure.   °· Your baby is not moving as much as it used to.   °Document Released: 04/26/2005 Document Revised: 05/01/2013 Document Reviewed: 02/05/2013 °ExitCare® Patient Information ©2015 [employer], LLC. This information is not intended to replace advice given to you by your health care provider. Make sure you discuss any questions you have with your health care provider. ° °

## 2015-01-06 NOTE — MAU Provider Note (Signed)
History     CSN: [id number]  Arrival date and time: 01/05/15 2329  Provider notified: 2343 Provider on unit: 2355 Provider at bedside: 0125   Chief Complaint  Patient presents with  . Labor Eval   HPI  Ms. Gabrielle Pierce is a 39 yo G2P1001 female at 36.[redacted] wks gestation by LMP, presenting with complaints of sudden onset of LLQ that woke her up out of her sleep.  She has a h/o IBS and usually has pain like this with constipation. She has had BM every 2-3 days, but reports a normal BM tonight. She is having some contractions and they worsen the pain.  Her prenatal care is significant for placenta previa (RESOLVED) GERD, BV and ADD. Her primary OB provider at [facility] is Dr. Juliene Pina.  Past Medical History  Diagnosis Date  . IBS (irritable bowel syndrome)   . Depression   . Anxiety     prev. use of PRN xanax  . Acute left lower quadrant pain 01/06/2015    Past Surgical History  Procedure Laterality Date  . Appendectomy    . Flexible sigmoidoscopy    . Wisdom tooth extraction      History reviewed. No pertinent family history.  Social History  Substance Use Topics  . Smoking status: Former Smoker    Types: Cigarettes  . Smokeless tobacco: Never Used  . Alcohol Use: No     Comment: Prior to preg.    Allergies: No Known Allergies  Prescriptions prior to admission  Medication Sig Dispense Refill Last Dose  . Prenatal Vit-Fe Fumarate-FA (MULTIVITAMIN-PRENATAL) 27-0.8 MG TABS tablet Take 1 tablet by mouth daily at 12 noon.   01/04/2015 at Unknown time  . amoxicillin-clavulanate (AUGMENTIN) 875-125 MG per tablet Take 1 tablet by mouth every 12 (twelve) hours. 14 tablet 0 More than a month at Unknown time  . amphetamine-dextroamphetamine (ADDERALL) 30 MG tablet Take 15-30 mg by mouth 3 (three) times daily. Take 30mg  in the morning, 30mg  in the afternoon and 15mg  in the evening   More than a month at Unknown time  . ibuprofen (ADVIL,MOTRIN) 200 MG tablet Take 400 mg by mouth every 6 (six)  hours as needed for pain.   More than a month at Unknown time    Review of Systems  Constitutional: Negative.   HENT: Negative.   Eyes: Negative.   Respiratory: Negative.   Gastrointestinal: Positive for constipation.       Have IBS; had BM tonight, but woke up out of sleep with sudden, sharp LLQ that worsens with UC's; UC's about every 5 mins; lots of (+) FM; no VB or LOF  Musculoskeletal: Negative.   Skin: Negative.   Neurological: Negative.   Endo/Heme/Allergies: Negative.   Psychiatric/Behavioral: Negative.    Results for orders placed or performed during the hospital encounter of 01/05/15 (from the past 24 hour(s))  Urinalysis, Routine w reflex microscopic (not at [facility])     Status: Abnormal   Collection Time: 01/06/15 12:05 AM  Result Value Ref Range   Color, Urine YELLOW YELLOW   APPearance CLOUDY (A) CLEAR   Specific Gravity, Urine 1.025 1.005 - 1.030   pH 8.5 (H) 5.0 - 8.0   Glucose, UA NEGATIVE NEGATIVE mg/dL   Hgb urine dipstick NEGATIVE NEGATIVE   Bilirubin Urine NEGATIVE NEGATIVE   Ketones, ur NEGATIVE NEGATIVE mg/dL   Protein, ur NEGATIVE NEGATIVE mg/dL   Urobilinogen, UA 0.2 0.0 - 1.0 mg/dL   Nitrite NEGATIVE NEGATIVE   Leukocytes, UA MODERATE (A) NEGATIVE  Urine microscopic-add on     Status: Abnormal   Collection Time: 01/06/15 12:05 AM  Result Value Ref Range   Squamous Epithelial / LPF RARE RARE   WBC, UA 7-10 <3 WBC/hpf   Bacteria, UA FEW (A) RARE   Urine-Other MUCOUS PRESENT    CEFM FHR: 130s / moderate variability / accels present TOCO: were every 3-5 mins, now irregular UC's  OB Limited U/S Cephalic presentation / AFI WNL / placenta posterior above cx os / no placental abruption  Physical Exam   Blood pressure 102/59, pulse 63, temperature 98.4 F (36.9 C), temperature source Oral, resp. rate 18, height  (1.651 m), weight 108.863 kg (240 lb), SpO2 100 %.  Physical Exam  Constitutional: She is oriented to person, place, and time. She  appears well-developed and well-nourished.  HENT:  Head: Normocephalic.  Eyes: Pupils are equal, round, and reactive to light.  Neck: Normal range of motion.  Cardiovascular: Normal rate, regular rhythm and normal heart sounds.   Respiratory: Effort normal.  GI: Soft. Bowel sounds are normal.  Genitourinary:  VE by RN: 1/50/high/vtx by Margo Aye  Musculoskeletal: Normal range of motion.  Neurological: She is alert and oriented to person, place, and time.  Skin: Skin is warm and dry.  Psychiatric: She has a normal mood and affect. Her behavior is normal. Judgment and thought content normal.    MAU Course  Procedures CCUA CEFM CBC OB Limited U/S Percocet 2 tabs Observation x 2 hrs Assessment and Plan  G2P1001 at 36.[redacted] wks gestation LLQ abdominal pain - relieved after Percocet and obs Category 1 FHR tracing  Discharge home Keep scheduled appointments in the office Call the office before coming to the hospital, for any further questions, problems or concerns  *Dr. Seymour Bars notified of assessment and plan - agrees  Raelyn Mora, Judie Petit MSN, CNM 01/06/2015, 1:32 AM

## 2015-01-06 NOTE — MAU Note (Signed)
Pt having considerable back pain as well d/t history of bulging discs and herniation.

## 2015-01-16 ENCOUNTER — Telehealth (HOSPITAL_COMMUNITY): Payer: Self-pay | Admitting: *Deleted

## 2015-01-16 ENCOUNTER — Encounter (HOSPITAL_COMMUNITY): Payer: Self-pay | Admitting: *Deleted

## 2015-01-16 NOTE — Telephone Encounter (Signed)
Preadmission screen  

## 2015-01-17 ENCOUNTER — Inpatient Hospital Stay (HOSPITAL_COMMUNITY)
Admission: AD | Admit: 2015-01-17 | Discharge: 2015-01-20 | DRG: 775 | Disposition: A | Discharge status: Home / Self Care | Payer: Managed Care, Other (non HMO) | Source: Ambulatory Visit | Attending: Obstetrics & Gynecology | Admitting: Obstetrics & Gynecology

## 2015-01-17 ENCOUNTER — Inpatient Hospital Stay (HOSPITAL_COMMUNITY): Payer: Managed Care, Other (non HMO) | Admitting: Anesthesiology

## 2015-01-17 ENCOUNTER — Encounter (HOSPITAL_COMMUNITY): Payer: Self-pay | Admitting: *Deleted

## 2015-01-17 DIAGNOSIS — O99892 Other specified diseases and conditions complicating childbirth: Secondary | ICD-10-CM

## 2015-01-17 DIAGNOSIS — Z283 Underimmunization status: Secondary | ICD-10-CM

## 2015-01-17 DIAGNOSIS — R05 Cough: Secondary | ICD-10-CM | POA: Diagnosis present

## 2015-01-17 DIAGNOSIS — O99344 Other mental disorders complicating childbirth: Secondary | ICD-10-CM | POA: Diagnosis present

## 2015-01-17 DIAGNOSIS — Z3A38 38 weeks gestation of pregnancy: Secondary | ICD-10-CM | POA: Diagnosis present

## 2015-01-17 DIAGNOSIS — O99214 Obesity complicating childbirth: Secondary | ICD-10-CM | POA: Diagnosis present

## 2015-01-17 DIAGNOSIS — O9962 Diseases of the digestive system complicating childbirth: Secondary | ICD-10-CM | POA: Diagnosis present

## 2015-01-17 DIAGNOSIS — O9989 Other specified diseases and conditions complicating pregnancy, childbirth and the puerperium: Secondary | ICD-10-CM

## 2015-01-17 DIAGNOSIS — Z87891 Personal history of nicotine dependence: Secondary | ICD-10-CM | POA: Diagnosis not present

## 2015-01-17 DIAGNOSIS — K219 Gastro-esophageal reflux disease without esophagitis: Secondary | ICD-10-CM | POA: Diagnosis present

## 2015-01-17 DIAGNOSIS — R1032 Left lower quadrant pain: Secondary | ICD-10-CM

## 2015-01-17 DIAGNOSIS — F988 Other specified behavioral and emotional disorders with onset usually occurring in childhood and adolescence: Secondary | ICD-10-CM | POA: Diagnosis present

## 2015-01-17 DIAGNOSIS — O4290 Premature rupture of membranes, unspecified as to length of time between rupture and onset of labor, unspecified weeks of gestation: Secondary | ICD-10-CM | POA: Diagnosis present

## 2015-01-17 DIAGNOSIS — O9952 Diseases of the respiratory system complicating childbirth: Secondary | ICD-10-CM | POA: Diagnosis present

## 2015-01-17 DIAGNOSIS — K589 Irritable bowel syndrome without diarrhea: Secondary | ICD-10-CM | POA: Diagnosis present

## 2015-01-17 DIAGNOSIS — Z2839 Other underimmunization status: Secondary | ICD-10-CM

## 2015-01-17 DIAGNOSIS — Z6841 Body Mass Index (BMI) 40.0 and over, adult: Secondary | ICD-10-CM

## 2015-01-17 HISTORY — DX: Personal history of nicotine dependence: Z87.891

## 2015-01-17 HISTORY — DX: Partial loss of teeth, unspecified cause, unspecified class: K08.409

## 2015-01-17 HISTORY — DX: Acquired absence of other specified parts of digestive tract: Z90.49

## 2015-01-17 LAB — CBC
HCT: 36.4 % (ref 36.0–46.0)
Hemoglobin: 11.8 g/dL — ABNORMAL LOW (ref 12.0–15.0)
MCH: 27.3 pg (ref 26.0–34.0)
MCHC: 32.4 g/dL (ref 30.0–36.0)
MCV: 84.1 fL (ref 78.0–100.0)
PLATELETS: 170 10*3/uL (ref 150–400)
RBC: 4.33 MIL/uL (ref 3.87–5.11)
RDW: 14.9 % (ref 11.5–15.5)
WBC: 11.4 10*3/uL — AB (ref 4.0–10.5)

## 2015-01-17 LAB — POCT FERN TEST: POCT FERN TEST: POSITIVE

## 2015-01-17 LAB — TYPE AND SCREEN
ABO/RH(D): B POS
ANTIBODY SCREEN: NEGATIVE

## 2015-01-17 MED ORDER — OXYTOCIN BOLUS FROM INFUSION: 500.0000 mL | INTRAVENOUS | Status: DC

## 2015-01-17 MED ORDER — FENTANYL 2.5 MCG/ML BUPIVACAINE 1/10 % EPIDURAL INFUSION (WH - ANES): 14.0000 mL/h | INTRAMUSCULAR | Status: DC | PRN

## 2015-01-17 MED ORDER — PHENYLEPHRINE 40 MCG/ML (10ML) SYRINGE FOR IV PUSH (FOR BLOOD PRESSURE SUPPORT)
80.0000 ug | PREFILLED_SYRINGE | INTRAVENOUS | Status: DC | PRN
  Filled 2015-01-17: qty 2
  Filled 2015-01-17: qty 20

## 2015-01-17 MED ORDER — OXYTOCIN 40 UNITS IN LACTATED RINGERS INFUSION - SIMPLE MED
62.5000 mL/h | INTRAVENOUS | Status: DC
  Filled 2015-01-17: qty 1000

## 2015-01-17 MED ORDER — DIPHENHYDRAMINE HCL 50 MG/ML IJ SOLN: 12.5000 mg | INTRAMUSCULAR | Status: DC | PRN

## 2015-01-17 MED ORDER — CITRIC ACID-SODIUM CITRATE 334-500 MG/5ML PO SOLN: 30.0000 mL | ORAL | Status: DC | PRN

## 2015-01-17 MED ORDER — TERBUTALINE SULFATE 1 MG/ML IJ SOLN
0.2500 mg | Freq: Once | INTRAMUSCULAR | Status: DC | PRN
  Filled 2015-01-17: qty 1

## 2015-01-17 MED ORDER — LACTATED RINGERS IV SOLN
INTRAVENOUS | Status: DC
  Administered 2015-01-17 – 2015-01-18 (×2): via INTRAVENOUS

## 2015-01-17 MED ORDER — ONDANSETRON HCL 4 MG/2ML IJ SOLN: 4.0000 mg | Freq: Four times a day (QID) | INTRAMUSCULAR | Status: DC | PRN

## 2015-01-17 MED ORDER — ACETAMINOPHEN 325 MG PO TABS: 650.0000 mg | ORAL_TABLET | ORAL | Status: DC | PRN

## 2015-01-17 MED ORDER — OXYCODONE-ACETAMINOPHEN 5-325 MG PO TABS: 1.0000 | ORAL_TABLET | ORAL | Status: DC | PRN

## 2015-01-17 MED ORDER — FENTANYL 2.5 MCG/ML BUPIVACAINE 1/10 % EPIDURAL INFUSION (WH - ANES)
14.0000 mL/h | INTRAMUSCULAR | Status: DC | PRN
  Administered 2015-01-18: 14 mL/h via EPIDURAL
  Filled 2015-01-17: qty 125

## 2015-01-17 MED ORDER — EPHEDRINE 5 MG/ML INJ
10.0000 mg | INTRAVENOUS | Status: DC | PRN
  Filled 2015-01-17: qty 2

## 2015-01-17 MED ORDER — OXYTOCIN 40 UNITS IN LACTATED RINGERS INFUSION - SIMPLE MED
1.0000 m[IU]/min | INTRAVENOUS | Status: DC
  Administered 2015-01-17: 2 m[IU]/min via INTRAVENOUS

## 2015-01-17 MED ORDER — FLEET ENEMA 7-19 GM/118ML RE ENEM: 1.0000 | ENEMA | RECTAL | Status: DC | PRN

## 2015-01-17 MED ORDER — LACTATED RINGERS IV SOLN
500.0000 mL | INTRAVENOUS | Status: DC | PRN
Start: 2015-01-17 — End: 2015-01-18
  Administered 2015-01-17: 500 mL via INTRAVENOUS

## 2015-01-17 MED ORDER — LIDOCAINE HCL (PF) 1 % IJ SOLN
30.0000 mL | INTRAMUSCULAR | Status: DC | PRN
  Administered 2015-01-18: 30 mL via SUBCUTANEOUS
  Filled 2015-01-17: qty 30

## 2015-01-17 MED ORDER — OXYCODONE-ACETAMINOPHEN 5-325 MG PO TABS: 2.0000 | ORAL_TABLET | ORAL | Status: DC | PRN

## 2015-01-17 NOTE — H&P (Signed)
Gabrielle Pierce is a 39 y.o. female G2P1001 [redacted]w[redacted]d presenting for SROM.  HPP/HPI:  AMA 38 yo.  Previous FT SVD.  Ultrascreen wnl.  [place] Isolated EIF.  Placenta previa resolved at 34+ wks, EFW 43%, AFI wnl.  Clear AF leak.  No regular UC yet.  Good FMs.  No PEC Sx.  OB History    Gravida Para Term Preterm AB TAB SAB Ectopic Multiple Living   0 0 0 0 0 0 1     Past Medical History  Diagnosis Date  . IBS (irritable bowel syndrome)   . Depression   . Anxiety     prev. use of PRN xanax  . Acute left lower quadrant pain 01/06/2015  . ADD (attention deficit disorder)    Past Surgical History  Procedure Laterality Date  . Appendectomy    . Flexible sigmoidoscopy    . Wisdom tooth extraction     Family History: family history includes Hashimoto's thyroiditis in her sister; Rheum arthritis in her mother. Social History:  reports that she has quit smoking. Her smoking use included Cigarettes. She has never used smokeless tobacco. She reports that she does not drink alcohol or use illicit drugs.  No Known Allergies  Dilation: 4 Effacement (%): 80 Station: -3 Exam by:: [employer], RN   Blood pressure 136/67, pulse 100, temperature 99 F (37.2 C), temperature source Oral, resp. rate 18, height  (1.651 m), weight 246 lb (111.585 kg). Exam Physical Exam   FHR 130-140's with good accelerations and variability, no deceleration. UCs irregular mild.  Started Pitocin.  HPP:  Patient Active Problem List   Diagnosis Date Noted  . Normal labor 01/17/2015  . Indication for care or intervention in labor or delivery 01/17/2015  . Acute left lower quadrant pain 01/06/2015    Prenatal labs: ABO, Rh: --/--/B POS (09/09 2100) Antibody: NEG (09/09 2100) Rubella:  Non-Immune RPR: Nonreactive (01/27 0000)  HBsAg: Negative (01/27 0000)  HIV: Non-reactive (01/27 0000)  Genetic testing: Ultrascreen wnl [place] anato: wnl except isolated EIF.  Placenta previa, resolved at 34+ wks. 1 hr GTT:  wnl GBS: Negative (08/17 0000)   Assessment/Plan: 38+ wks with SROM.  Pitocin induction.  FHR Cat 1.  Epidural PRN.  Expectant management towards probable vaginal delivery.   Isabella Roemmich,MARIE-LYNE 01/17/2015, 10:38 PM

## 2015-01-17 NOTE — MAU Note (Addendum)
Pt reports leaking of fluid since 1830. Pt reports mild cramping

## 2015-01-17 NOTE — MAU Note (Signed)
Leaked fld about an hour ago and leaked one time since. Clear fld 3cm on Tues. Feeling a lot of pressure but not really pain. For induction 0630 on Tues.

## 2015-01-17 NOTE — Anesthesia Preprocedure Evaluation (Signed)
Anesthesia Evaluation  Patient identified by MRN, date of birth, ID band Patient awake    Reviewed: Allergy & Precautions, H&P , Patient's Chart, lab work & pertinent test results  Airway Mallampati: II  TM Distance: >3 FB Neck ROM: full    Dental  (+) Teeth Intact   Pulmonary Recent URI , Residual Cough, former smoker,    breath sounds clear to auscultation       Cardiovascular  Rhythm:regular Rate:Normal     Neuro/Psych    GI/Hepatic GERD  Medicated,  Endo/Other  Morbid obesity  Renal/GU      Musculoskeletal   Abdominal   Peds  Hematology   Anesthesia Other Findings Recent URI     Reproductive/Obstetrics (+) Pregnancy                             Anesthesia Physical Anesthesia Plan  ASA: III  Anesthesia Plan: Epidural   Post-op Pain Management:    Induction:   Airway Management Planned:   Additional Equipment:   Intra-op Plan:   Post-operative Plan:   Informed Consent: I have reviewed the patients History and Physical, chart, labs and discussed the procedure including the risks, benefits and alternatives for the proposed anesthesia with the patient or authorized representative who has indicated his/her understanding and acceptance.   Dental Advisory Given  Plan Discussed with:   Anesthesia Plan Comments: (Labs checked- platelets confirmed with RN in room. Fetal heart tracing, per RN, reported to be stable enough for sitting procedure. Discussed epidural, and patient consents to the procedure:  included risk of possible headache,backache, failed block, allergic reaction, and nerve injury. This patient was asked if she had any questions or concerns before the procedure started.)        Anesthesia Quick Evaluation

## 2015-01-18 ENCOUNTER — Encounter (HOSPITAL_COMMUNITY): Payer: Self-pay

## 2015-01-18 LAB — RPR: RPR: NONREACTIVE

## 2015-01-18 MED ORDER — DIPHENHYDRAMINE HCL 25 MG PO CAPS: 25.0000 mg | ORAL_CAPSULE | Freq: Four times a day (QID) | ORAL | Status: DC | PRN

## 2015-01-18 MED ORDER — WITCH HAZEL-GLYCERIN EX PADS: 1.0000 "application " | MEDICATED_PAD | CUTANEOUS | Status: DC | PRN

## 2015-01-18 MED ORDER — IBUPROFEN 600 MG PO TABS
600.0000 mg | ORAL_TABLET | Freq: Four times a day (QID) | ORAL | Status: DC
  Administered 2015-01-18 – 2015-01-19 (×4): 600 mg via ORAL
  Filled 2015-01-18 (×4): qty 1

## 2015-01-18 MED ORDER — OXYCODONE-ACETAMINOPHEN 5-325 MG PO TABS: 2.0000 | ORAL_TABLET | ORAL | Status: DC | PRN

## 2015-01-18 MED ORDER — LIDOCAINE HCL (PF) 1 % IJ SOLN
INTRAMUSCULAR | Status: DC | PRN
  Administered 2015-01-18: 4 mL
  Administered 2015-01-18: 6 mL via EPIDURAL

## 2015-01-18 MED ORDER — SENNOSIDES-DOCUSATE SODIUM 8.6-50 MG PO TABS
2.0000 | ORAL_TABLET | ORAL | Status: DC
  Administered 2015-01-19 – 2015-01-20 (×2): 2 via ORAL
  Filled 2015-01-18 (×2): qty 2

## 2015-01-18 MED ORDER — DIBUCAINE 1 % RE OINT: 1.0000 "application " | TOPICAL_OINTMENT | RECTAL | Status: DC | PRN

## 2015-01-18 MED ORDER — LANOLIN HYDROUS EX OINT: TOPICAL_OINTMENT | CUTANEOUS | Status: DC | PRN

## 2015-01-18 MED ORDER — ONDANSETRON HCL 4 MG PO TABS: 4.0000 mg | ORAL_TABLET | ORAL | Status: DC | PRN

## 2015-01-18 MED ORDER — OXYCODONE-ACETAMINOPHEN 5-325 MG PO TABS
1.0000 | ORAL_TABLET | ORAL | Status: DC | PRN
  Administered 2015-01-18 – 2015-01-20 (×6): 1 via ORAL
  Filled 2015-01-18 (×6): qty 1

## 2015-01-18 MED ORDER — PRENATAL MULTIVITAMIN CH
1.0000 | ORAL_TABLET | Freq: Every day | ORAL | Status: DC
  Filled 2015-01-18: qty 1

## 2015-01-18 MED ORDER — TETANUS-DIPHTH-ACELL PERTUSSIS 5-2.5-18.5 LF-MCG/0.5 IM SUSP: 0.5000 mL | Freq: Once | INTRAMUSCULAR | Status: DC

## 2015-01-18 MED ORDER — ACETAMINOPHEN 325 MG PO TABS: 650.0000 mg | ORAL_TABLET | ORAL | Status: DC | PRN

## 2015-01-18 MED ORDER — BENZOCAINE-MENTHOL 20-0.5 % EX AERO
1.0000 "application " | INHALATION_SPRAY | CUTANEOUS | Status: DC | PRN
  Administered 2015-01-18: 1 via TOPICAL
  Filled 2015-01-18: qty 56

## 2015-01-18 MED ORDER — ZOLPIDEM TARTRATE 5 MG PO TABS: 5.0000 mg | ORAL_TABLET | Freq: Every evening | ORAL | Status: DC | PRN

## 2015-01-18 MED ORDER — OXYTOCIN 40 UNITS IN LACTATED RINGERS INFUSION - SIMPLE MED: 62.5000 mL/h | INTRAVENOUS | Status: DC | PRN

## 2015-01-18 MED ORDER — ONDANSETRON HCL 4 MG/2ML IJ SOLN: 4.0000 mg | INTRAMUSCULAR | Status: DC | PRN

## 2015-01-18 MED ORDER — SIMETHICONE 80 MG PO CHEW: 80.0000 mg | CHEWABLE_TABLET | ORAL | Status: DC | PRN

## 2015-01-18 NOTE — Progress Notes (Signed)
Delivery of live viable female by Dr. Seymour Bars, APGARS 9,9

## 2015-01-18 NOTE — Anesthesia Postprocedure Evaluation (Signed)
Anesthesia Post Note  Patient: Gabrielle Pierce  Procedure(s) Performed: * No procedures listed *  Anesthesia type: Epidural  Patient location: Mother/Baby  Post pain: Pain level controlled  Post assessment: Post-op Vital signs reviewed  Last Vitals:  Filed Vitals:   01/18/15 0830  BP: 129/65  Pulse: 87  Temp: 37.1 C  Resp:     Post vital signs: Reviewed  Level of consciousness:alert  Complications: No apparent anesthesia complications

## 2015-01-18 NOTE — Progress Notes (Signed)
Subjective: Doing well, pain controled, UCs q3 min,  Anesthesia epidural   Objective: BP 134/66 mmHg  Pulse 80  Temp(Src) 98.5 F (36.9 C) (Oral)  Resp 18  Ht  (1.651 m)  Wt 246 lb (111.585 kg)  BMI 40.94 kg/m2  SpO2 98%   FHT:  FHR: 130 bpm, variability: moderate,  accelerations:  Present,  decelerations:  Absent UC:   regular, every 3 minutes, difficult to trace VE:   7/100/Left Occiput post/-1          IUPC   Assessment / Plan: Induction of labor due to PROM,  progressing well on pitocin.  Occiput post, will change position, using the Peanut. Will monitor UCs with IUPC/Increase Pito per MVUs.  Fetal Wellbeing:  Category I Pain Control:  Epidural  Anticipated MOD:  NSVD  Fujiko Picazo,MARIE-LYNE 01/18/2015, 5:26 AM

## 2015-01-18 NOTE — Anesthesia Procedure Notes (Signed)

## 2015-01-18 NOTE — Clinical Social Work Maternal (Signed)
  CLINICAL SOCIAL WORK MATERNAL/CHILD NOTE  Patient Details  Name: Gabrielle Pierce MRN: [id number] Date of Birth: July 14, 1975  Date:  01/18/2015  Clinical Social Worker Initiating Note:  Norlene Duel, LCSW Date/ Time Initiated:  01/18/15/1430     Child's Name:  Gabrielle Pierce   Legal Guardian:   (Parents)   Need for Interpreter:  None   Date of Referral:  01/17/15     Reason for Referral:  Other (Comment)   Referral Source:  [facility]   Address:  [place], [place] [address]  Phone number:   [phone])   Household Members:  Minor Children, Spouse   Natural Supports (not living in the home):  Extended Family, Immediate Family   Professional Supports: None   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   Met with mother who was pleasant and receptive to social work.  Parents are married and have one other dependent age 31.   MOB reports hx of depression and anxiety and states that she has not been treated in over 2 years and has not experienced any acute symptoms during this time.    She denies any current symptoms of depression or anxiety.  She also denies any history of illicit drug use.  No acute social concerns noted or reported at this time.   She reports having an excellent support system.   Mother informed of social work Fish farm manager.   SW Plan/Description:    Discussed signs/symptoms of PP Depression and available resources No further intervention required No barriers to discharge    Gabrielle Pierce,  Gabrielle Pierce J, LCSW 01/18/2015, 4:04 PM

## 2015-01-19 ENCOUNTER — Encounter (HOSPITAL_COMMUNITY): Payer: Self-pay | Admitting: *Deleted

## 2015-01-19 LAB — CBC
HEMATOCRIT: 34.3 % — AB (ref 36.0–46.0)
Hemoglobin: 11 g/dL — ABNORMAL LOW (ref 12.0–15.0)
MCH: 27.2 pg (ref 26.0–34.0)
MCHC: 32.1 g/dL (ref 30.0–36.0)
MCV: 84.9 fL (ref 78.0–100.0)
Platelets: 163 10*3/uL (ref 150–400)
RBC: 4.04 MIL/uL (ref 3.87–5.11)
RDW: 15.1 % (ref 11.5–15.5)
WBC: 13.4 10*3/uL — AB (ref 4.0–10.5)

## 2015-01-19 MED ORDER — IBUPROFEN 800 MG PO TABS
800.0000 mg | ORAL_TABLET | Freq: Three times a day (TID) | ORAL | Status: DC
  Administered 2015-01-19: 800 mg via ORAL
  Filled 2015-01-19: qty 1

## 2015-01-19 NOTE — Progress Notes (Signed)
PPD 1 SVD  S:  Reports feeling really crampy still             Tolerating po/ No nausea or vomiting             Bleeding is moderate             Pain minimally controlled with motrin and percocet             Up ad lib / ambulatory / voiding QS  Newborn breast & bottle feeding    O:       VS: BP 88/46 mmHg  Pulse 68  Temp(Src) 98.3 F (36.8 C) (Oral)  Resp 18  Ht 5' 5"  (1.651 m)  Wt 111.585 kg (246 lb)  BMI 40.94 kg/m2  SpO2 98%  Breastfeeding? Unknown   LABS:              Recent Labs  01/17/15 2100 01/19/15 0528  WBC 11.4* 13.4*  HGB 11.8* 11.0*  PLT 170 163               Blood type: --/--/B POS (09/09 2100)  Rubella: Nonimmune (01/27 0000)   - MMR ordered             Tdap   2016  (needs flu)               I&O: Intake/Output      09/10 0701 - 09/11 0700 09/11 0701 - 09/12 0700   Urine (mL/kg/hr) 375 (0.1)    Blood 65 (0)    Total Output 440     Net -440                        Physical Exam:             Alert and oriented X3  Abdomen: soft, non-tender, non-distended              Fundus: firm, non-tender, Ueven  Perineum: mild edema  Lochia: moderate  Extremities: trace edema, no calf pain or tenderness    A: PPD # 1   Doing well - stable status  P: Routine post partum orders  Increase ibuprofen dose / K-pad to abdomen / keep bladder empty             [place], [place], MSN, [facility] 01/19/2015, 11:20 AM

## 2015-01-20 DIAGNOSIS — Z283 Underimmunization status: Secondary | ICD-10-CM

## 2015-01-20 DIAGNOSIS — Z2839 Other underimmunization status: Secondary | ICD-10-CM

## 2015-01-20 DIAGNOSIS — O9989 Other specified diseases and conditions complicating pregnancy, childbirth and the puerperium: Secondary | ICD-10-CM

## 2015-01-20 MED ORDER — OXYCODONE-ACETAMINOPHEN 5-325 MG PO TABS: 1.0000 | ORAL_TABLET | ORAL | Status: AC | PRN

## 2015-01-20 MED ORDER — MEASLES, MUMPS & RUBELLA VAC ~~LOC~~ INJ
0.5000 mL | INJECTION | Freq: Once | SUBCUTANEOUS | Status: DC
  Filled 2015-01-20: qty 0.5

## 2015-01-20 MED ORDER — IBUPROFEN 800 MG PO TABS: 800.0000 mg | ORAL_TABLET | Freq: Three times a day (TID) | ORAL | Status: AC

## 2015-01-20 NOTE — Progress Notes (Signed)
PPD #2- SVD  Subjective:   Reports feeling well, ready for discharge Tolerating po/ No nausea or vomiting Bleeding is light Pain controlled with Motrin and Percocet Up ad lib / ambulatory / voiding without problems Newborn: bottlefeeding     Objective:   VS: VS:  Filed Vitals:   01/18/15 1744 01/19/15 0539 01/19/15 1720 01/20/15 0613  BP: 130/68 88/46 119/61 124/71  Pulse: 83 68 85 61  Temp: 98.5 F (36.9 C) 98.3 F (36.8 C) 98.5 F (36.9 C) 98 F (36.7 C)  TempSrc: Oral Oral Oral Oral  Resp: 19 18 19 20   Height:      Weight:      SpO2:        LABS:  Recent Labs  01/17/15 2100 01/19/15 0528  WBC 11.4* 13.4*  HGB 11.8* 11.0*  PLT 170 163   Blood type: --/--/B POS (09/09 2100) Rubella: Nonimmune (01/27 0000)                I&O: Intake/Output      09/11 0701 - 09/12 0700 09/12 0701 - 09/13 0700   Urine (mL/kg/hr)     Blood     Total Output       Net              Physical Exam: Alert and oriented X3 Abdomen: soft, non-tender, non-distended  Fundus: firm, non-tender, U-3 Perineum: Well approximated, no significant erythema, edema, or drainage; healing well. Lochia: small Extremities: No edema, no calf pain or tenderness    Assessment: PPD #2  G2P2002/ S/P:spontaneous vaginal, 1st degree perineal and labial laceration Rubella non-immune Doing well - stable for discharge home   Plan: MMR before discharge Discharge home RX's:  Ibuprofen 647m po Q 6 hrs prn pain #30 Refill x 0 Percocet 5/325 1 to 2 po Q 4 hrs prn pain #30 Refill x 0 [facility]ine [facility]    BJulianne Handler N MSN, CNM 01/20/2015, 11:03 AM

## 2015-01-20 NOTE — Discharge Summary (Signed)
Obstetric Discharge Summary Reason for Admission: rupture of membranes and 38.4 weeks Prenatal Procedures: AMA, isolated EIF, Rubella non-immune Intrapartum Procedures: spontaneous vaginal delivery and Pitocin, epidural Postpartum Procedures: Rubella Ig Complications-Operative and Postpartum: 1st degree perineal laceration and labial laceration HEMOGLOBIN  Date Value Ref Range Status  01/19/2015 11.0* 12.0 - 15.0 g/dL Final   HCT  Date Value Ref Range Status  01/19/2015 34.3* 36.0 - 46.0 % Final    Physical Exam:  General: alert, cooperative and no distress Lochia: appropriate Uterine Fundus: firm Incision: healing well, no significant drainage, no dehiscence, no significant erythema DVT Evaluation: No evidence of DVT seen on physical exam. Negative Homan's sign. No cords or calf tenderness. No significant calf/ankle edema.  Discharge Diagnoses: Term Pregnancy-delivered  Discharge Information: Date: 01/20/2015 Activity: pelvic rest Diet: routine Medications: PNV, Ibuprofen and Percocet Condition: stable Instructions: refer to practice specific booklet Discharge to: home Follow-up Information    Follow up with MODY,VAISHALI R, MD. Schedule an appointment as soon as possible for a visit in 6 weeks.   Specialty:  Obstetrics and Gynecology   Contact information:   [address] [place] [address] [phone]       Newborn Data: Live born female on 01/18/15 Birth Weight: 5 lb 14 oz (2665 g) APGAR: 9, 9  Home with mother.  Lashawndra Lampkins, N 01/20/2015, 10:51 AM

## 2015-01-20 NOTE — Plan of Care (Signed)
Problem: Discharge Progression Outcomes Goal: MMR given as ordered Outcome: Not Applicable Date Met:  63/01/60 Pt declined vaccine.

## 2015-01-21 ENCOUNTER — Inpatient Hospital Stay (HOSPITAL_COMMUNITY): Admission: RE | Admit: 2015-01-21 | Payer: Managed Care, Other (non HMO) | Source: Ambulatory Visit

## 2015-01-28 ENCOUNTER — Inpatient Hospital Stay (HOSPITAL_COMMUNITY)
Admission: AD | Admit: 2015-01-28 | Payer: Managed Care, Other (non HMO) | Source: Ambulatory Visit | Admitting: Obstetrics & Gynecology

## 2015-03-28 ENCOUNTER — Other Ambulatory Visit: Payer: Self-pay | Admitting: Obstetrics & Gynecology

## 2015-04-11 ENCOUNTER — Encounter (HOSPITAL_COMMUNITY): Payer: Self-pay

## 2015-04-11 ENCOUNTER — Encounter (HOSPITAL_COMMUNITY): Payer: Self-pay | Admitting: Anesthesiology

## 2015-04-11 NOTE — Anesthesia Preprocedure Evaluation (Deleted)
Anesthesia Evaluation  Patient identified by MRN, date of birth, ID band Patient awake    Reviewed: Allergy & Precautions, H&P , NPO status , Patient's Chart, lab work & pertinent test results  Airway Mallampati: II  TM Distance: >3 FB Neck ROM: full    Dental  (+) Teeth Intact, Dental Advisory Given   Pulmonary Recent URI , Residual Cough, former smoker,    breath sounds clear to auscultation       Cardiovascular negative cardio ROS   Rhythm:regular Rate:Normal     Neuro/Psych Anxiety Depression ADD   GI/Hepatic GERD  Medicated,  Endo/Other  Morbid obesity  Renal/GU      Musculoskeletal   Abdominal (+) + obese,   Peds  Hematology   Anesthesia Other Findings Recent URI     Reproductive/Obstetrics (+) Pregnancy                            Anesthesia Physical  Anesthesia Plan  ASA: III  Anesthesia Plan: General   Post-op Pain Management:    Induction:   Airway Management Planned: LMA and Oral ETT  Additional Equipment:   Intra-op Plan:   Post-operative Plan: Extubation in OR  Informed Consent: I have reviewed the patients History and Physical, chart, labs and discussed the procedure including the risks, benefits and alternatives for the proposed anesthesia with the patient or authorized representative who has indicated his/her understanding and acceptance.   Dental Advisory Given  Plan Discussed with:   Anesthesia Plan Comments: (Check AM labs, If GERDS present plan ETT)        Anesthesia Quick Evaluation

## 2015-04-16 MED ORDER — DOXYCYCLINE HYCLATE 100 MG IV SOLR
100.0000 mg | Freq: Once | INTRAVENOUS | Status: DC
  Filled 2015-04-16: qty 100

## 2015-04-17 ENCOUNTER — Ambulatory Visit (HOSPITAL_COMMUNITY)
Admission: RE | Admit: 2015-04-17 | Payer: Managed Care, Other (non HMO) | Source: Ambulatory Visit | Admitting: Obstetrics & Gynecology

## 2015-04-17 HISTORY — DX: Gastro-esophageal reflux disease without esophagitis: K21.9

## 2015-04-17 SURGERY — DILATATION & CURETTAGE/HYSTEROSCOPY WITH ESSURE
Anesthesia: Choice

## 2015-04-30 NOTE — H&P (Signed)
Gabrielle Pierce is an 39 y.o. female [id number]. Called and cancelled surgery due to no baby sitting help.    ROS  -Physical Exam N/A  V.Juliene PinaMody, MD.

## 2015-05-01 ENCOUNTER — Encounter (HOSPITAL_COMMUNITY): Payer: Self-pay | Admitting: Anesthesiology

## 2015-05-01 ENCOUNTER — Ambulatory Visit (HOSPITAL_COMMUNITY)
Admission: RE | Admit: 2015-05-01 | Payer: Managed Care, Other (non HMO) | Source: Ambulatory Visit | Admitting: Obstetrics & Gynecology

## 2015-05-01 ENCOUNTER — Encounter (HOSPITAL_COMMUNITY): Admission: RE | Payer: Self-pay | Source: Ambulatory Visit

## 2015-05-01 SURGERY — DILATATION & CURETTAGE/HYSTEROSCOPY WITH ESSURE
Anesthesia: Choice | Laterality: Bilateral

## 2017-01-05 IMAGING — US US OB LIMITED
1 series · 13 of 28 positions shown · non-contrast
Comparison: none

[Series 1: us ob follow up · 32 acquisitions, 13 frames shown]
[im 2/32]
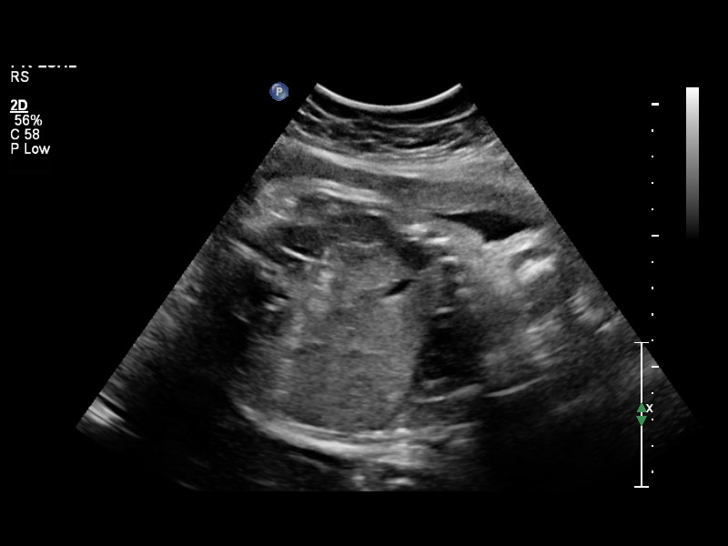
[im 4/32]
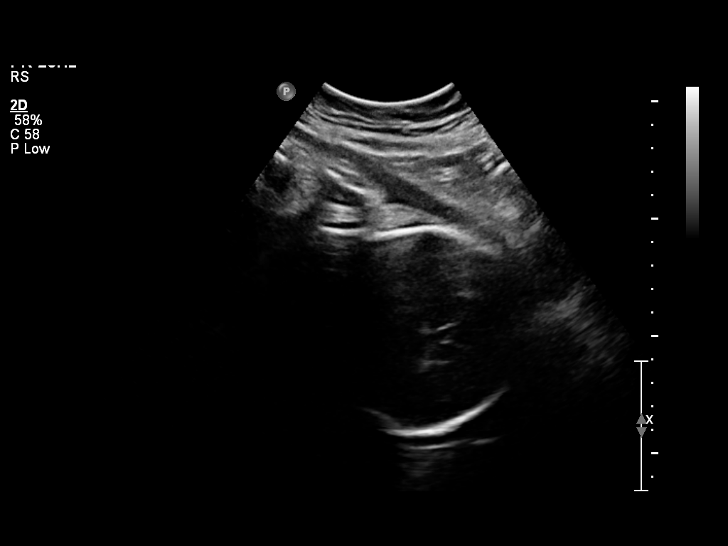
[im 6/32]
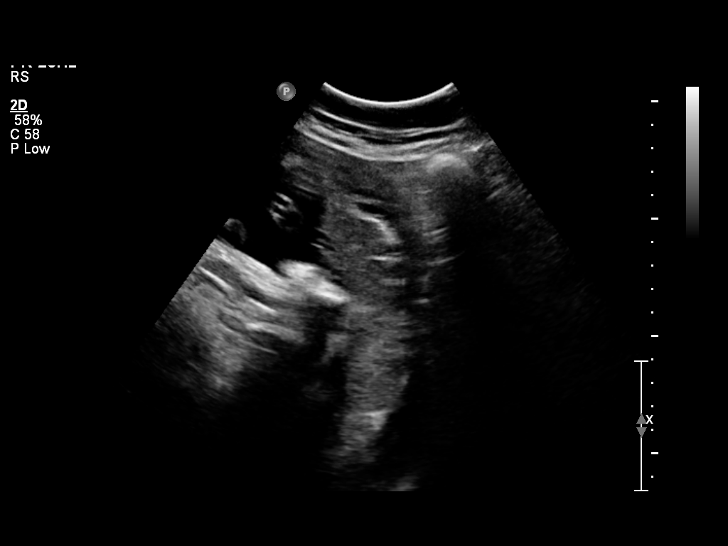
[im 9/32]
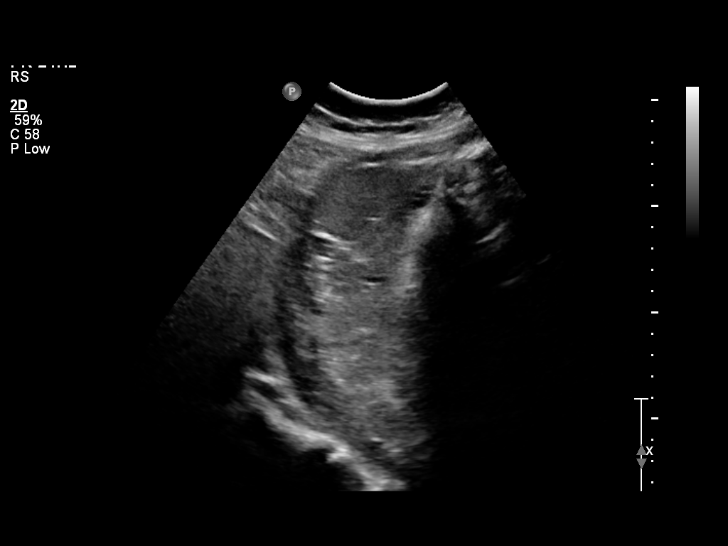
[im 11/32]
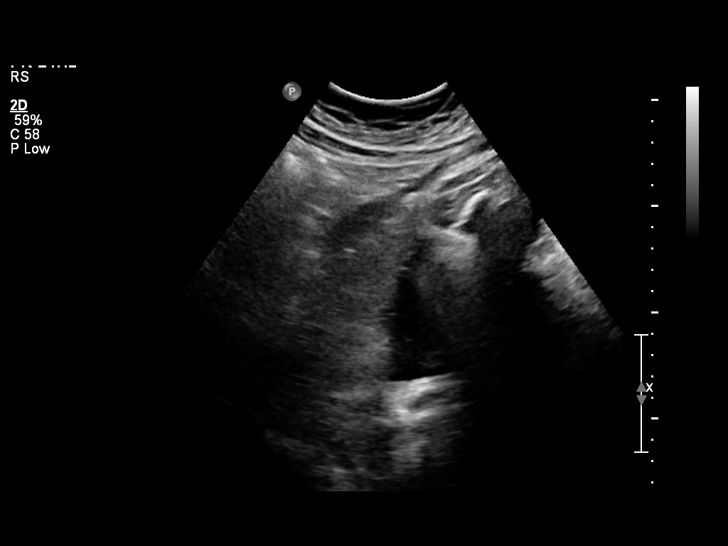
[im 13/32]
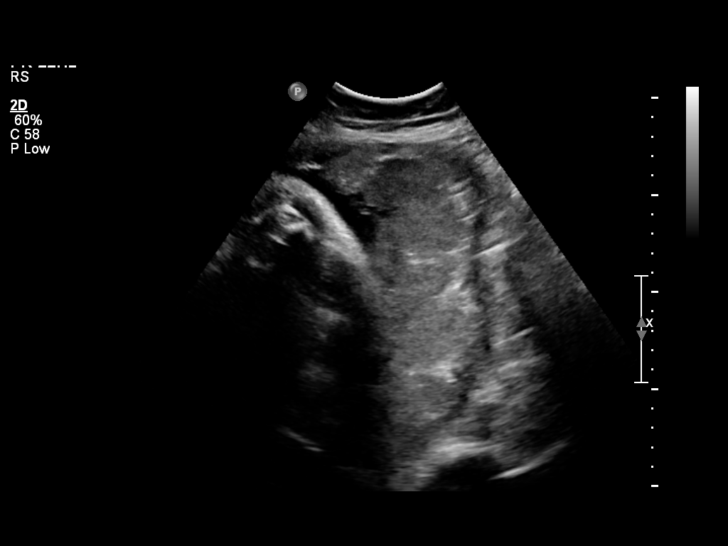
[im 17/32]
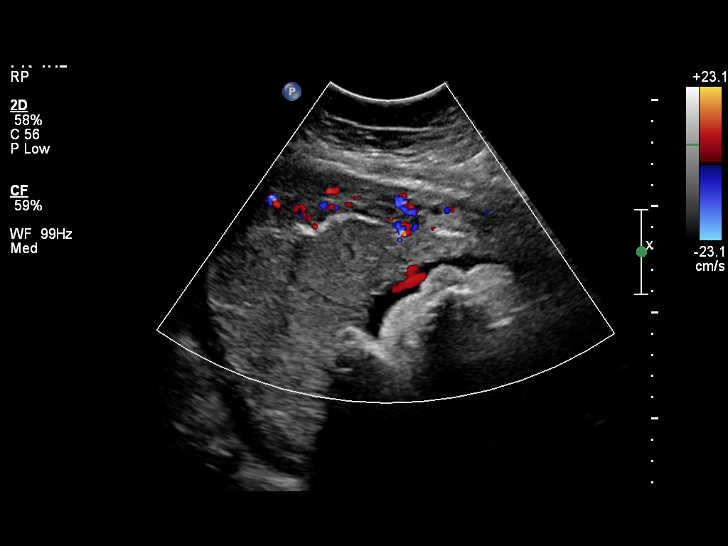
[im 19/32]
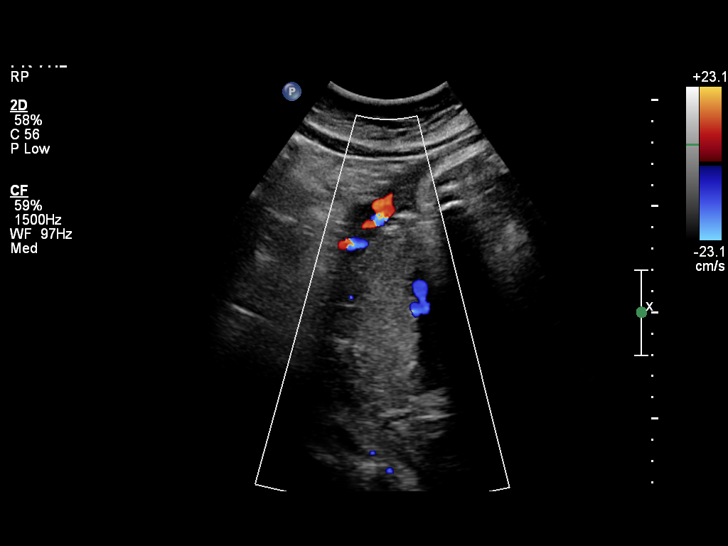
[im 21/32]
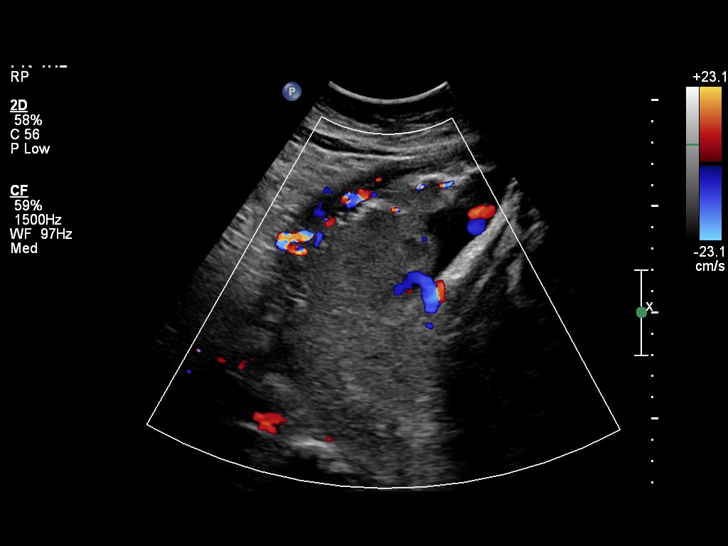
[im 23/32]
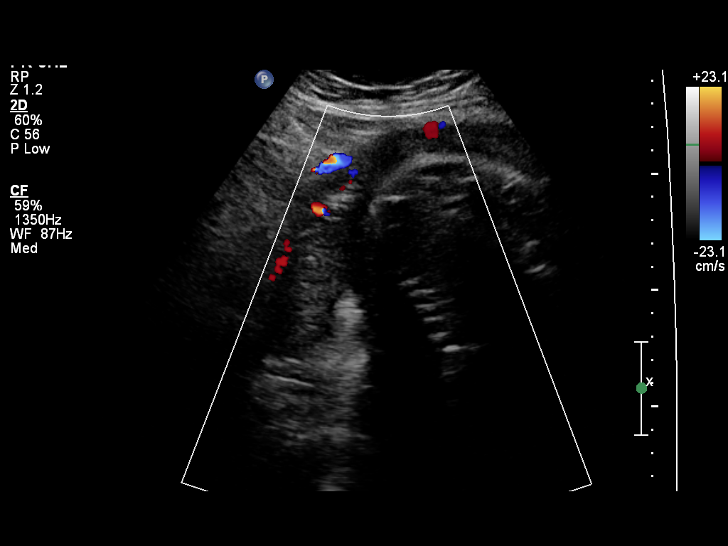
[im 26/32]
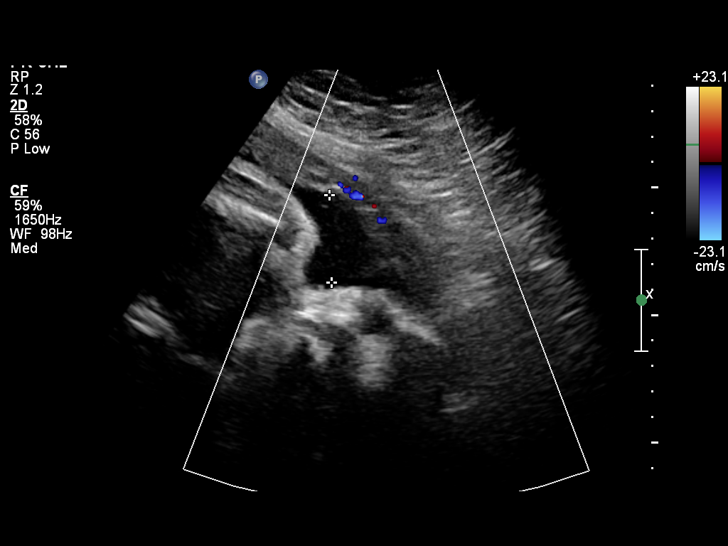
[im 28/32]
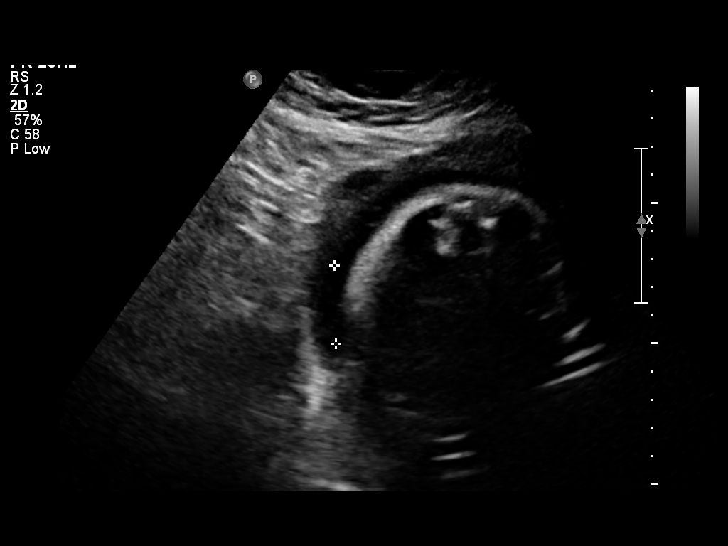
[im 30/32]
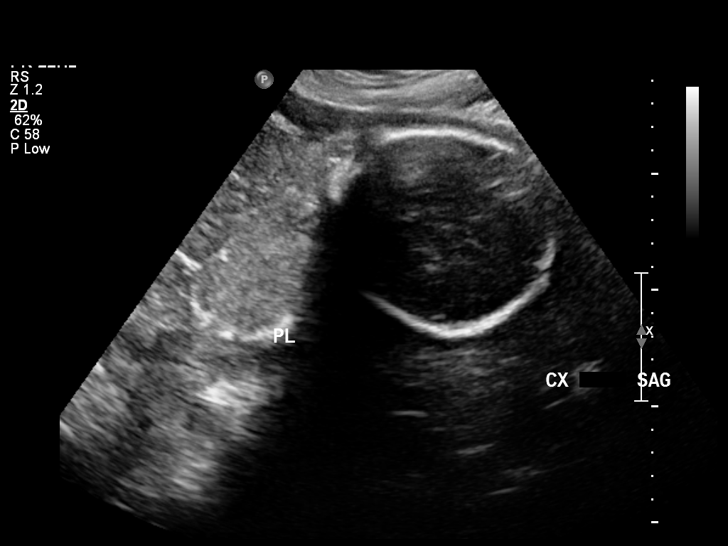

[13 of 28 positions shown; findings below may reference images not displayed]

OBSTETRICS REPORT
(Signed Final 01/06/2015 [DATE])

Service(s) Provided

[HOSPITAL]                                         76815.0
Indications

Determine fetal presentation using ultrasound         Z36
Abdominal pain in pregnancy
36 weeks gestation of pregnancy
Obesity complicating pregnancy, third trimester
Fetal Evaluation

Num Of Fetuses:    1
Fetal Heart Rate:  150                          bpm
Cardiac Activity:  Observed
Presentation:      Cephalic
Placenta:          Posterior, above cervical
os

Comment:    No placental abruption identified on todays Ultrasound.

Amniotic Fluid
AFI FV:      Subjectively within normal limits
AFI Sum:     10.88   cm       30  %Tile     Larg Pckt:    3.42  cm
RUQ:   2.77    cm   RLQ:    1.89   cm    LUQ:   2.8     cm   LLQ:    3.42   cm
Gestational Age

Clinical EDD:  36w 6d                                        EDD:   01/28/15
Best:          36w 6d     Det. By:  Clinical EDD             EDD:   01/28/15
Cervix Uterus Adnexa

Cervix:       Not visualized (advanced GA >06wks)

Adnexa:     No abnormality visualized.
Impression

SIUP at 10w0d
cephalic presentation
AFI is normal
no previa
Recommendations

Follow up ultrasounds as clinically indicated.

questions or concerns.

## 2023-05-27 ENCOUNTER — Other Ambulatory Visit (HOSPITAL_COMMUNITY): Payer: Self-pay

## 2023-05-27 MED ORDER — WEGOVY 0.25 MG/0.5ML ~~LOC~~ SOAJ
0.2500 mg | SUBCUTANEOUS | 0 refills | Status: DC
  Filled 2023-05-27 – 2023-06-06 (×3): qty 2, 28d supply, fill #0

## 2023-05-30 ENCOUNTER — Other Ambulatory Visit (HOSPITAL_COMMUNITY): Payer: Self-pay

## 2023-06-01 ENCOUNTER — Other Ambulatory Visit (HOSPITAL_COMMUNITY): Payer: Self-pay

## 2023-06-06 ENCOUNTER — Other Ambulatory Visit (HOSPITAL_COMMUNITY): Payer: Self-pay

## 2023-06-17 ENCOUNTER — Ambulatory Visit
Admission: EM | Admit: 2023-06-17 | Discharge: 2023-06-17 | Disposition: A | Discharge status: Home / Self Care | Payer: Managed Care, Other (non HMO) | Attending: Family Medicine | Admitting: Family Medicine

## 2023-06-17 DIAGNOSIS — B349 Viral infection, unspecified: Secondary | ICD-10-CM | POA: Diagnosis present

## 2023-06-17 DIAGNOSIS — J029 Acute pharyngitis, unspecified: Secondary | ICD-10-CM | POA: Diagnosis present

## 2023-06-17 LAB — POCT RAPID STREP A (OFFICE): Rapid Strep A Screen: NEGATIVE

## 2023-06-17 MED ORDER — LIDOCAINE VISCOUS HCL 2 % MT SOLN: 15.0000 mL | Freq: Four times a day (QID) | OROMUCOSAL | 0 refills | Status: AC | PRN

## 2023-06-17 NOTE — ED Provider Notes (Signed)
 [facility]    CSN: [id number] Arrival date & time: 06/17/23  0855      History   Chief Complaint Chief Complaint  Patient presents with   Sore Throat    HPI Gabrielle Pierce is a 48 y.o. female  presents for evaluation of URI symptoms for 2 days. Patient reports associated symptoms of ST, mild sinus congestion. Denies N/V/D, fever, cough, ear pain, body aches, or SOB. Patient does not have a hx of asthma. Patient does have a smoking hx.  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  Pt has taken mucinex OTC for symptoms. Pt has no other concerns at this time.    Sore Throat    Past Medical History:  Diagnosis Date   Acute left lower quadrant pain 01/06/2015   ADD (attention deficit disorder)    Anxiety    prev. use of PRN xanax   Depression    Former smoker    GERD (gastroesophageal reflux disease)    Hx of appendectomy    Hx of wisdom tooth extraction    IBS (irritable bowel syndrome)     Patient Active Problem List   Diagnosis Date Noted   Rubella nonimmune status, delivered, current hospitalization 01/20/2015   Postpartum care following vaginal delivery (9/10) 01/18/2015   Postpartum state 01/18/2015   Indication for care or intervention in labor or delivery 01/17/2015   Acute left lower quadrant pain 01/06/2015    Past Surgical History:  Procedure Laterality Date   APPENDECTOMY     FLEXIBLE SIGMOIDOSCOPY     WISDOM TOOTH EXTRACTION      OB History     Gravida  2   Para  2   Term  2   Preterm  0   AB  0   Living  2      SAB  0   IAB  0   Ectopic  0   Multiple  0   Live Births  2            Home Medications    Prior to Admission medications   Medication Sig Start Date End Date Taking? Authorizing Provider  lidocaine  (XYLOCAINE ) 2 % solution Use as directed 15 mLs in the mouth or throat every 6 (six) hours as needed (sore throat). Gargle and spit do not swallow 06/17/23  Yes Jennelle Pinkstaff, Jodi R, NP   acetaminophen  (TYLENOL ) 500 MG tablet Take 1,000 mg by mouth every 6 (six) hours as needed for headache.    [provider]  amphetamine-dextroamphetamine (ADDERALL XR) 20 MG 24 hr capsule Take 20 mg by mouth every morning.    [provider]  amphetamine-dextroamphetamine (ADDERALL) 30 MG tablet Take 30 mg by mouth 2 (two) times daily.    [provider]  calcium carbonate (TUMS - DOSED IN MG ELEMENTAL CALCIUM) 500 MG chewable tablet Chew 2 tablets by mouth daily as needed for indigestion or heartburn.     [provider]  desogestrel-ethinyl estradiol (RECLIPSEN) 0.15-30 MG-MCG tablet Take 1 tablet by mouth daily.    [provider]  guaiFENesin (MUCINEX) 600 MG 12 hr tablet Take 600 mg by mouth as needed for cough.    [provider]  ibuprofen  (ADVIL ,MOTRIN ) 800 MG tablet Take 1 tablet (800 mg total) by mouth every 8 (eight) hours. Patient not taking: Reported on 04/28/2015 01/20/15   Sung Hollering, CNM  naphazoline-glycerin  (CLEAR EYES) 0.012-0.2 % SOLN Place 3-4 drops into both eyes every 4 (four)  hours as needed (For dryness.). Patient uses Rohto brand, same active ingredients.    [provider]  oxyCODONE -acetaminophen  (PERCOCET/ROXICET) 5-325 MG per tablet Take 1 tablet by mouth every 4 (four) hours as needed (for pain scale 4-7). Patient not taking: Reported on 04/28/2015 01/20/15   Sung Hollering, CNM  pantoprazole (PROTONIX) 20 MG tablet Take 20 mg by mouth daily as needed for heartburn.     [provider]  pseudoephedrine (SUDAFED) 30 MG tablet Take 30 mg by mouth every 4 (four) hours as needed for congestion.    [provider]  Semaglutide -Weight Management (WEGOVY ) 0.25 MG/0.5ML SOAJ Inject 0.25 mg into the skin once a week. 05/27/23       Family History Family History  Problem Relation Age of Onset   Rheum arthritis Mother    Hashimoto's thyroiditis Sister     Social History Social History    Tobacco Use   Smoking status: Former    Current packs/day: 0.00    Average packs/day: 0.1 packs/day for 20.0 years (2.0 ttl pk-yrs)    Types: Cigarettes    Start date: 07/10/1994    Quit date: 07/10/2014    Years since quitting: 8.9   Smokeless tobacco: Never  Substance Use Topics   Alcohol use: Yes    Comment: occasional   Drug use: No     Allergies   Patient has no known allergies.   Review of Systems Review of Systems  HENT:  Positive for congestion and sore throat.      Physical Exam Triage Vital Signs ED Triage Vitals  Encounter Vitals Group     BP 06/17/23 0930 120/83     Systolic BP Percentile --      Diastolic BP Percentile --      Pulse Rate 06/17/23 0930 87     Resp 06/17/23 0930 18     Temp 06/17/23 0930 98.3 F (36.8 C)     Temp Source 06/17/23 0930 Oral     SpO2 06/17/23 0930 98 %     Weight --      Height --      Head Circumference --      Peak Flow --      Pain Score 06/17/23 0929 4     Pain Loc --      Pain Education --      Exclude from Growth Chart --    No data found.  Updated Vital Signs BP 120/83 (BP Location: Right Arm)   Pulse 87   Temp 98.3 F (36.8 C) (Oral)   Resp 18   LMP 05/27/2023   SpO2 98%   Breastfeeding No   Visual Acuity Right Eye Distance:   Left Eye Distance:   Bilateral Distance:    Right Eye Near:   Left Eye Near:    Bilateral Near:     Physical Exam Vitals and nursing note reviewed.  Constitutional:      General: She is not in acute distress.    Appearance: She is well-developed. She is not ill-appearing.  HENT:     Head: Normocephalic and atraumatic.     Right Ear: Tympanic membrane and ear canal normal.     Left Ear: Tympanic membrane and ear canal normal.     Nose: No congestion or rhinorrhea.     Mouth/Throat:     Mouth: Mucous membranes are moist.     Pharynx: Oropharynx is clear. Uvula midline. Posterior oropharyngeal erythema present.     Tonsils: No  tonsillar exudate or tonsillar  abscesses.     Comments: Tonsil stones noted  Eyes:     Conjunctiva/sclera: Conjunctivae normal.     Pupils: Pupils are equal, round, and reactive to light.  Cardiovascular:     Rate and Rhythm: Normal rate and regular rhythm.     Heart sounds: Normal heart sounds.  Pulmonary:     Effort: Pulmonary effort is normal.     Breath sounds: Normal breath sounds.  Musculoskeletal:     Cervical back: Normal range of motion and neck supple.  Lymphadenopathy:     Cervical: No cervical adenopathy.  Skin:    General: Skin is warm and dry.  Neurological:     General: No focal deficit present.     Mental Status: She is alert and oriented to person, place, and time.  Psychiatric:        Mood and Affect: Mood normal.        Behavior: Behavior normal.      UC Treatments / Results  Labs (all labs ordered are listed, but only abnormal results are displayed) Labs Reviewed  CULTURE, GROUP A STREP [facility])  POCT RAPID STREP A (OFFICE)    EKG   Radiology No results found.  Procedures Procedures (including critical care time)  Medications Ordered in UC Medications - No data to display  Initial Impression / Assessment and Plan / UC Course  I have reviewed the triage vital signs and the nursing notes.  Pertinent labs & imaging results that were available during my care of the patient were reviewed by me and considered in my medical decision making (see chart for details).     Reviewed exam and sx with patient. No red flags. Neg rapid strep, will culture.  Patient on COVID testing.  Discussed likely viral illness and symptomatic treatment.  Lidocaine  gargle as needed patient structured to gargle and spit not swallow.  PCP follow-up 2 to 3 days for recheck.  ER precautions reviewed and patient verbalized understanding. Final Clinical Impressions(s) / UC Diagnoses   Final diagnoses:  Sore throat  Viral illness  Viral pharyngitis     Discharge Instructions      The clinic will  contact you with results of the throat culture done today if positive.  You may do lidocaine  gargle as needed for sore throat.  Gargle and spit do not swallow.  You may also do salt water gargles and warm liquids such as teas and honey.  May take over-the-counter ibuprofen  or Tylenol  as needed.  Lots of rest and fluids.  Please follow-up with your PCP 2 to 3 days for recheck.  Please go to the ER if you develop any worsening symptoms.  Hope you feel better soon!    ED Prescriptions     Medication Sig Dispense Auth. Provider   lidocaine  (XYLOCAINE ) 2 % solution Use as directed 15 mLs in the mouth or throat every 6 (six) hours as needed (sore throat). Gargle and spit do not swallow 100 mL Nishtha Raider, Jodi R, NP      PDMP not reviewed this encounter.   Loreda Myla SAUNDERS, NP 06/17/23 1007

## 2023-06-17 NOTE — Discharge Instructions (Addendum)
 The clinic will contact you with results of the throat culture done today if positive.  You may do lidocaine  gargle as needed for sore throat.  Gargle and spit do not swallow.  You may also do salt water gargles and warm liquids such as teas and honey.  May take over-the-counter ibuprofen  or Tylenol  as needed.  Lots of rest and fluids.  Please follow-up with your PCP 2 to 3 days for recheck.  Please go to the ER if you develop any worsening symptoms.  Hope you feel better soon!

## 2023-06-17 NOTE — ED Triage Notes (Signed)
 Pt present with c/o sore throat and white bumps on the back of her throat X 2 days. Pt denies fever and states she feels she is swallowing bricks.

## 2023-06-20 LAB — CULTURE, GROUP A STREP (THRC)

## 2023-07-01 ENCOUNTER — Other Ambulatory Visit (HOSPITAL_COMMUNITY): Payer: Self-pay

## 2023-07-01 MED ORDER — WEGOVY 0.5 MG/0.5ML ~~LOC~~ SOAJ
0.5000 mg | SUBCUTANEOUS | 0 refills | Status: DC
  Filled 2023-07-01 – 2023-07-05 (×2): qty 2, 28d supply, fill #0

## 2023-07-05 ENCOUNTER — Other Ambulatory Visit (HOSPITAL_COMMUNITY): Payer: Self-pay

## 2023-07-06 ENCOUNTER — Other Ambulatory Visit (HOSPITAL_COMMUNITY): Payer: Self-pay

## 2023-08-02 ENCOUNTER — Other Ambulatory Visit (HOSPITAL_COMMUNITY): Payer: Self-pay

## 2023-08-02 MED ORDER — WEGOVY 1 MG/0.5ML ~~LOC~~ SOAJ
1.0000 mg | SUBCUTANEOUS | 0 refills | Status: DC
  Filled 2023-08-02 (×2): qty 2, 28d supply, fill #0

## 2023-08-03 ENCOUNTER — Other Ambulatory Visit (HOSPITAL_COMMUNITY): Payer: Self-pay

## 2023-09-20 ENCOUNTER — Other Ambulatory Visit (HOSPITAL_COMMUNITY): Payer: Self-pay

## 2023-09-20 MED ORDER — ZEPBOUND 7.5 MG/0.5ML ~~LOC~~ SOAJ
7.5000 mg | SUBCUTANEOUS | 0 refills | Status: AC
  Filled 2023-09-20: qty 2, 28d supply, fill #0

## 2023-09-20 MED ORDER — ZEPBOUND 5 MG/0.5ML ~~LOC~~ SOAJ
5.0000 mg | SUBCUTANEOUS | 0 refills | Status: AC
  Filled 2023-09-20: qty 2, 28d supply, fill #0

## 2023-09-20 MED ORDER — ZEPBOUND 10 MG/0.5ML ~~LOC~~ SOAJ
10.0000 mg | SUBCUTANEOUS | 0 refills | Status: AC
  Filled 2023-09-20: qty 2, 28d supply, fill #0

## 2023-09-21 ENCOUNTER — Other Ambulatory Visit: Payer: Self-pay

## 2023-09-21 ENCOUNTER — Other Ambulatory Visit (HOSPITAL_COMMUNITY): Payer: Self-pay

## 2023-09-22 ENCOUNTER — Other Ambulatory Visit (HOSPITAL_COMMUNITY): Payer: Self-pay

## 2023-09-29 ENCOUNTER — Other Ambulatory Visit (HOSPITAL_COMMUNITY): Payer: Self-pay

## 2023-09-30 ENCOUNTER — Other Ambulatory Visit (HOSPITAL_COMMUNITY): Payer: Self-pay

## 2023-10-10 ENCOUNTER — Other Ambulatory Visit (HOSPITAL_COMMUNITY): Payer: Self-pay

## 2023-10-13 ENCOUNTER — Other Ambulatory Visit (HOSPITAL_COMMUNITY): Payer: Self-pay

## 2023-10-15 ENCOUNTER — Other Ambulatory Visit (HOSPITAL_COMMUNITY): Payer: Self-pay

## 2023-10-19 ENCOUNTER — Other Ambulatory Visit: Payer: Self-pay

## 2023-10-20 ENCOUNTER — Other Ambulatory Visit (HOSPITAL_COMMUNITY): Payer: Self-pay

## 2024-03-12 ENCOUNTER — Ambulatory Visit
Admission: EM | Admit: 2024-03-12 | Discharge: 2024-03-12 | Disposition: A | Discharge status: Home / Self Care | Attending: Family Medicine | Admitting: Family Medicine

## 2024-03-12 DIAGNOSIS — F17200 Nicotine dependence, unspecified, uncomplicated: Secondary | ICD-10-CM

## 2024-03-12 DIAGNOSIS — J209 Acute bronchitis, unspecified: Secondary | ICD-10-CM | POA: Diagnosis not present

## 2024-03-12 MED ORDER — PROMETHAZINE-DM 6.25-15 MG/5ML PO SYRP: 5.0000 mL | ORAL_SOLUTION | Freq: Three times a day (TID) | ORAL | 0 refills | Status: AC | PRN

## 2024-03-12 MED ORDER — ALBUTEROL SULFATE HFA 108 (90 BASE) MCG/ACT IN AERS: 1.0000 | INHALATION_SPRAY | Freq: Four times a day (QID) | RESPIRATORY_TRACT | 0 refills | Status: AC | PRN

## 2024-03-12 MED ORDER — PREDNISONE 20 MG PO TABS: ORAL_TABLET | ORAL | 0 refills | Status: AC

## 2024-03-12 NOTE — Discharge Instructions (Addendum)
 Will manage your bronchitis with prednisone and albuterol inhaler. Use cough syrup as needed.

## 2024-03-12 NOTE — ED Provider Notes (Signed)
 [facility]  Note:  This document was prepared using Conservation officer, historic buildings and may include unintentional dictation errors.  MRN: [id number] DOB: 25-Aug-1975  Subjective:   Gabrielle Pierce is a 48 y.o. female presenting for 2 day history of chest tightness, wheezing, body aches, scratchy throat, throat pain, productive cough. No fever, chest pain. No history of asthma, COPD. Smokes 1/2ppd.  Patient has previously done well with albuterol and steroids.  Would like aggressive management as she is supposed to start a new job this week.  No current facility-administered medications for this encounter.  Current Outpatient Medications:    acetaminophen  (TYLENOL ) 500 MG tablet, Take 1,000 mg by mouth every 6 (six) hours as needed for headache., Disp: , Rfl:    amphetamine-dextroamphetamine (ADDERALL XR) 20 MG 24 hr capsule, Take 20 mg by mouth every morning., Disp: , Rfl:    amphetamine-dextroamphetamine (ADDERALL) 30 MG tablet, Take 30 mg by mouth 2 (two) times daily., Disp: , Rfl:    calcium carbonate (TUMS - DOSED IN MG ELEMENTAL CALCIUM) 500 MG chewable tablet, Chew 2 tablets by mouth daily as needed for indigestion or heartburn. , Disp: , Rfl:    desogestrel-ethinyl estradiol (RECLIPSEN) 0.15-30 MG-MCG tablet, Take 1 tablet by mouth daily., Disp: , Rfl:    guaiFENesin (MUCINEX) 600 MG 12 hr tablet, Take 600 mg by mouth as needed for cough., Disp: , Rfl:    ibuprofen  (ADVIL ,MOTRIN ) 800 MG tablet, Take 1 tablet (800 mg total) by mouth every 8 (eight) hours. (Patient not taking: Reported on 04/28/2015), Disp: 30 tablet, Rfl: 0   lidocaine  (XYLOCAINE ) 2 % solution, Use as directed 15 mLs in the mouth or throat every 6 (six) hours as needed (sore throat). Gargle and spit do not swallow, Disp: 100 mL, Rfl: 0   naphazoline-glycerin  (CLEAR EYES) 0.012-0.2 % SOLN, Place 3-4 drops into both eyes every 4 (four) hours as needed (For dryness.). Patient uses Rohto brand, same  active ingredients., Disp: , Rfl:    oxyCODONE -acetaminophen  (PERCOCET/ROXICET) 5-325 MG per tablet, Take 1 tablet by mouth every 4 (four) hours as needed (for pain scale 4-7). (Patient not taking: Reported on 04/28/2015), Disp: 30 tablet, Rfl: 0   pantoprazole (PROTONIX) 20 MG tablet, Take 20 mg by mouth daily as needed for heartburn. , Disp: , Rfl:    pseudoephedrine (SUDAFED) 30 MG tablet, Take 30 mg by mouth every 4 (four) hours as needed for congestion., Disp: , Rfl:    tirzepatide  (ZEPBOUND ) 10 MG/0.5ML Pen, Inject 10 mg (0.5 mL) into the skin once a week., Disp: 2 mL, Rfl: 0   tirzepatide  (ZEPBOUND ) 5 MG/0.5ML Pen, Inject 5 mg (0.5 mL) into the skin once a week, Disp: 2 mL, Rfl: 0   tirzepatide  (ZEPBOUND ) 7.5 MG/0.5ML Pen, Inject 7.5 mg into the skin once a week., Disp: 2 mL, Rfl: 0   No Known Allergies  Past Medical History:  Diagnosis Date   Acute left lower quadrant pain 01/06/2015   ADD (attention deficit disorder)    Anxiety    prev. use of PRN xanax   Depression    Former smoker    GERD (gastroesophageal reflux disease)    Hx of appendectomy    Hx of wisdom tooth extraction    IBS (irritable bowel syndrome)      Past Surgical History:  Procedure Laterality Date   APPENDECTOMY     FLEXIBLE SIGMOIDOSCOPY     WISDOM TOOTH EXTRACTION      Family History  Problem Relation Age of Onset   Rheum arthritis Mother    Hashimoto's thyroiditis Sister     Social History   Tobacco Use   Smoking status: Every Day    Current packs/day: 0.50    Average packs/day: 0.1 packs/day for 21.8 years (2.9 ttl pk-yrs)    Types: Cigarettes    Start date: 07/10/1994    Last attempt to quit: 07/10/2014   Smokeless tobacco: Never  Vaping Use   Vaping status: Never Used  Substance Use Topics   Alcohol use: Yes    Comment: occasional   Drug use: No    ROS   Objective:   Vitals: BP (!) 153/90 (BP Location: Left Arm)   Pulse 88   Temp 99.1 F (37.3 C) (Oral)   Resp 18   LMP   (Within Weeks) Comment: 1 1/2 week  SpO2 98%   BP Readings from Last 3 Encounters:  03/12/24 (!) 153/90  06/17/23 120/83  01/20/15 124/71   Physical Exam Constitutional:      General: She is not in acute distress.    Appearance: Normal appearance. She is well-developed and normal weight. She is not ill-appearing, toxic-appearing or diaphoretic.  HENT:     Head: Normocephalic and atraumatic.     Right Ear: Tympanic membrane, ear canal and external ear normal. No drainage or tenderness. No middle ear effusion. There is no impacted cerumen. Tympanic membrane is not erythematous or bulging.     Left Ear: Tympanic membrane, ear canal and external ear normal. No drainage or tenderness.  No middle ear effusion. There is no impacted cerumen. Tympanic membrane is not erythematous or bulging.     Nose: Nose normal. No congestion or rhinorrhea.     Mouth/Throat:     Mouth: Mucous membranes are moist. No oral lesions.     Pharynx: No pharyngeal swelling, oropharyngeal exudate, posterior oropharyngeal erythema or uvula swelling.     Tonsils: No tonsillar exudate or tonsillar abscesses.  Eyes:     General: No scleral icterus.       Right eye: No discharge.        Left eye: No discharge.     Extraocular Movements: Extraocular movements intact.     Right eye: Normal extraocular motion.     Left eye: Normal extraocular motion.     Conjunctiva/sclera: Conjunctivae normal.  Cardiovascular:     Rate and Rhythm: Normal rate and regular rhythm.     Heart sounds: Normal heart sounds. No murmur heard.    No friction rub. No gallop.  Pulmonary:     Effort: Pulmonary effort is normal. No respiratory distress.     Breath sounds: No stridor. No wheezing, rhonchi or rales.  Chest:     Chest wall: No tenderness.  Musculoskeletal:     Cervical back: Normal range of motion and neck supple.  Lymphadenopathy:     Cervical: No cervical adenopathy.  Skin:    General: Skin is warm and dry.  Neurological:      General: No focal deficit present.     Mental Status: She is alert and oriented to person, place, and time.  Psychiatric:        Mood and Affect: Mood normal.        Behavior: Behavior normal.     Assessment and Plan :   PDMP not reviewed this encounter.  1. Acute bronchitis, unspecified organism   2. Smoker    Will manage for acute bronchitis with prednisone, albuterol and supportive  care.  Deferred imaging given clear cardiopulmonary exam, hemodynamically stable vital signs. Counseled patient on potential for adverse effects with medications prescribed/recommended today, ER and return-to-clinic precautions discussed, patient verbalized understanding.    Christopher Savannah, [place] 03/12/24 [phone]

## 2024-03-12 NOTE — ED Triage Notes (Signed)
 Pt reports wheezing, chest tightness x 1 day; body aches and cough x 3 days. OTC cold meds gives some relief.
